# Patient Record
Sex: Female | Born: 1990 | Race: White | Hispanic: No | Marital: Single | Smoking: Never smoker
Health system: Southern US, Community
[De-identification: ages and names within clinical notes are randomized; demographics above are authoritative.]

## PROBLEM LIST (undated history)

## (undated) DIAGNOSIS — F419 Anxiety disorder, unspecified: Secondary | ICD-10-CM

## (undated) DIAGNOSIS — G43909 Migraine, unspecified, not intractable, without status migrainosus: Secondary | ICD-10-CM

## (undated) HISTORY — DX: Migraine, unspecified, not intractable, without status migrainosus: G43.909

## (undated) HISTORY — PX: WISDOM TOOTH EXTRACTION: SHX21

## (undated) HISTORY — DX: Anxiety disorder, unspecified: F41.9

## (undated) NOTE — *Deleted (*Deleted)
Chief Complaint:  Vaginal Bleeding   First Provider Initiated Contact with Patient 04/25/20 1956     HPI: Elizabeth [name] is a 73 y.o. G1P0000 at [redacted]w[redacted]d who presents to maternity admissions reporting one episode of pink spotting in her underwear this afternoon. She thinks she had some brown discharge yesterday but is not sure. She has had some mild cramping today but no tightness or Braxton-Hicks. She called into her office 04/19/20 reporting abnormal discharge she thinks might be BV. Denies vaginal bleeding, leaking of fluid, decreased fetal movement, fever, falls, or recent illness.   Past Medical History:  Diagnosis Date  . Anxiety    OB History  Gravida Para Term Preterm AB Living  1 0 0 0 0 0  SAB TAB Ectopic Multiple Live Births  0 0 0 0 0    # Outcome Date GA Lbr Len/2nd Weight Sex Delivery Anes PTL Lv  1 Current            Past Surgical History:  Procedure Laterality Date  . WISDOM TOOTH EXTRACTION     Family History  Problem Relation Age of Onset  . Cancer Maternal Aunt    Social History   Tobacco Use  . Smoking status: Never Smoker  . Smokeless tobacco: Never Used  Substance Use Topics  . Alcohol use: Not Currently    Comment: social  . Drug use: No   No Known Allergies Medications Prior to Admission  Medication Sig Dispense Refill Last Dose  . aspirin EC 81 MG tablet Take 81 mg by mouth daily. Swallow whole.   04/25/2020 at Unknown time  . famotidine (PEPCID) 20 MG tablet Take 1 tablet (20 mg total) by mouth 2 (two) times daily. 60 tablet 1 04/25/2020 at Unknown time  . Prenatal Vit-Fe Fumarate-FA (MULTIVITAMIN-PRENATAL) 27-0.8 MG TABS tablet Take 1 tablet by mouth daily at 12 noon.   04/25/2020 at Unknown time  . Vitamin D, Cholecalciferol, 50 MCG (2000 UT) CAPS Take by mouth.   04/25/2020 at Unknown time  . Butalbital-APAP-Caffeine 50-325-40 MG capsule Take 1-2 capsules by mouth every 6 (six) hours as needed for headache. (Patient not taking: Reported on 01/11/2020)  30 capsule 1   . promethazine (PHENERGAN) 25 MG tablet Take 1 tablet (25 mg total) by mouth every 6 (six) hours as needed for nausea or vomiting. (Patient not taking: Reported on 01/11/2020) 30 tablet 1     I have reviewed patient's Past Medical Hx, Surgical Hx, Family Hx, Social Hx, medications and allergies.   ROS:  Review of Systems  Constitutional: Negative for fatigue and fever.  HENT: Negative for congestion and sore throat.   Respiratory: Negative for shortness of breath.   Gastrointestinal: Negative for nausea.  Genitourinary: Positive for pelvic pain (occasional cramping, no tightness), vaginal bleeding (pink) and vaginal discharge (increased x1wk).  Neurological: Negative for dizziness and syncope.  All other systems reviewed and are negative.   Physical Exam   Patient Vitals for the past 24 hrs:  BP Temp Temp src Pulse Resp SpO2 Height Weight  04/25/20 2001 127/65 - - (!) 113 - - - -  04/25/20 1931 137/79 - - (!) 124 - - - -  04/25/20 1912 (!) 145/85 99.1 F (37.3 C) Oral (!) 128 17 99 % 5\' 4"  (1.626 m) 227 lb (103 kg)    Constitutional: Well-developed, well-nourished female in no acute distress.  Cardiovascular: normal rate & rhythm, no murmur Respiratory: normal effort, lung sounds clear throughout GI: Abd soft, non-tender, gravid  appropriate for gestational age. Pos BS x 4 MS: Extremities nontender, no edema, normal ROM Neurologic: Alert and oriented x 4.  Pelvic: NEFG, physiologic discharge (off-white discharge noted), no blood.  Dilation: Closed Effacement (%): Thick Cervical Position: Posterior Station: Ballotable Presentation: Vertex Exam by:: Edd Arbour, CNM  Fetal Tracing: reactive Baseline: 145 Variability: moderate Accelerations: present Decelerations: none Toco: UI   Labs: Results for orders placed or performed during the hospital encounter of 04/25/20 (from the past 24 hour(s))  Urinalysis, Routine w reflex microscopic Urine, Clean Catch      Status: Abnormal   Collection Time: 04/25/20  7:16 PM  Result Value Ref Range   Color, Urine STRAW (A) YELLOW   APPearance CLEAR CLEAR   Specific Gravity, Urine 1.005 1.005 - 1.030   pH 6.0 5.0 - 8.0   Glucose, UA NEGATIVE NEGATIVE mg/dL   Hgb urine dipstick NEGATIVE NEGATIVE   Bilirubin Urine NEGATIVE NEGATIVE   Ketones, ur NEGATIVE NEGATIVE mg/dL   Protein, ur NEGATIVE NEGATIVE mg/dL   Nitrite NEGATIVE NEGATIVE   Leukocytes,Ua NEGATIVE NEGATIVE  Protein / creatinine ratio, urine     Status: None   Collection Time: 04/25/20  7:39 PM  Result Value Ref Range   Creatinine, Urine 39.45 mg/dL   Total Protein, Urine <6.0 mg/dL   Protein Creatinine Ratio        0.00 - 0.15 mg/mg[Cre]  CBC     Status: Abnormal   Collection Time: 04/25/20  7:48 PM  Result Value Ref Range   WBC 15.4 (H) 4.0 - 10.5 K/uL   RBC 3.99 3.87 - 5.11 MIL/uL   Hemoglobin 12.6 12.0 - 15.0 g/dL   HCT 01.0 36 - 46 %   MCV 96.2 80.0 - 100.0 fL   MCH 31.6 26.0 - 34.0 pg   MCHC 32.8 30.0 - 36.0 g/dL   RDW 27.2 53.6 - 64.4 %   Platelets 190 150 - 400 K/uL   nRBC 0.0 0.0 - 0.2 %  Comprehensive metabolic panel     Status: Abnormal   Collection Time: 04/25/20  7:48 PM  Result Value Ref Range   Sodium 132 (L) 135 - 145 mmol/L   Potassium 3.5 3.5 - 5.1 mmol/L   Chloride 101 98 - 111 mmol/L   CO2 21 (L) 22 - 32 mmol/L   Glucose, Bld 89 70 - 99 mg/dL   BUN 6 6 - 20 mg/dL   Creatinine, Ser 0.34 0.44 - 1.00 mg/dL   Calcium 8.6 (L) 8.9 - 10.3 mg/dL   Total Protein 6.3 (L) 6.5 - 8.1 g/dL   Albumin 3.0 (L) 3.5 - 5.0 g/dL   AST 16 15 - 41 U/L   ALT 15 0 - 44 U/L   Alkaline Phosphatase 89 38 - 126 U/L   Total Bilirubin 0.4 0.3 - 1.2 mg/dL   GFR, Estimated >74 >25 mL/min   Anion gap 10 5 - 15  Wet prep, genital     Status: Abnormal   Collection Time: 04/25/20  8:16 PM  Result Value Ref Range   Yeast Wet Prep HPF POC NONE SEEN NONE SEEN   Trich, Wet Prep NONE SEEN NONE SEEN   Clue Cells Wet Prep HPF POC NONE SEEN NONE  SEEN   WBC, Wet Prep HPF POC MANY (A) NONE SEEN   Sperm NONE SEEN    Imaging:  No results found.  MAU Course: Orders Placed This Encounter  Procedures  . Wet prep, genital  . Urinalysis, Routine w reflex  microscopic Urine, Clean Catch  . CBC  . Comprehensive metabolic panel  . Protein / creatinine ratio, urine   MDM: Preeclampsia workup normal Wet prep/UA normal Ultrasound pending  Care turned over to Donia Ast, NP at 2124  Speculum exam performed, cervix appears visually closed, non-friable cervix, no bleeding noted on exam. [place] order placed. Report given and care turned over to Wynelle Bourgeois, CNM.  Marylen Ponto, NP  9:43 PM 04/25/2020

---

## 2014-10-03 ENCOUNTER — Encounter: Payer: Self-pay | Admitting: Obstetrics & Gynecology

## 2014-10-07 ENCOUNTER — Encounter: Payer: Self-pay | Admitting: Obstetrics & Gynecology

## 2014-10-07 ENCOUNTER — Ambulatory Visit (INDEPENDENT_AMBULATORY_CARE_PROVIDER_SITE_OTHER): Payer: Self-pay | Admitting: Obstetrics & Gynecology

## 2014-10-07 VITALS — BP 107/71 | HR 103 | Ht 64.0 in | Wt 151.1 lb

## 2014-10-07 DIAGNOSIS — Z124 Encounter for screening for malignant neoplasm of cervix: Secondary | ICD-10-CM

## 2014-10-07 DIAGNOSIS — Z01419 Encounter for gynecological examination (general) (routine) without abnormal findings: Secondary | ICD-10-CM

## 2014-10-07 DIAGNOSIS — Z113 Encounter for screening for infections with a predominantly sexual mode of transmission: Secondary | ICD-10-CM

## 2014-10-07 DIAGNOSIS — Z0000 Encounter for general adult medical examination without abnormal findings: Secondary | ICD-10-CM

## 2014-10-07 MED ORDER — LEVONORGEST-ETH ESTRAD 91-DAY 0.15-0.03 &0.01 MG PO TABS: 1.0000 | ORAL_TABLET | Freq: Every day | ORAL | Status: DC

## 2014-10-07 NOTE — Progress Notes (Signed)
Here today for new patient physical and pap smear.  [facility] showed "mild abnormalities" no follow recommended by them.  Has Skyla and would like it removed because it caused her skin to break out and she has cramping usually around her period.  Wanted to discuss other BC options.

## 2014-10-07 NOTE — Progress Notes (Signed)
Subjective:    Elizabeth [name] is a 24 y.o. SW G0 female who presents for an annual exam. The patient has no complaints today. Except she is having acne and cramps the week prior to her period every month. This started [facility]. She tried the OCPs but couldn't remember them. She gained 40# on depo provera. The patient is sexually active. GYN screening history: last pap: was abnormal: uncertain what diagnosis. The patient wears seatbelts: yes. The patient participates in regular exercise: no. Has the patient ever been transfused or tattooed?: yes. The patient reports that there is not domestic violence in her life.   Menstrual History: OB History    No data available      Menarche age: [age]  Patient's last menstrual period was 09/23/2014.    The following portions of the patient's history were reviewed and updated as appropriate: allergies, current medications, past family history, past medical history, past social history, past surgical history and problem list.  Review of Systems A comprehensive review of systems was negative. 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 She broke up with her BF and not been having sex for the last month.   Objective:    BP 107/71 mmHg  Pulse 103  Ht 5\' 4"  (1.626 m)  Wt 151 lb 2 oz (68.55 kg)  BMI 25.93 kg/m2  LMP 09/23/2014  General Appearance:    Alert, cooperative, no distress, appears stated age  Head:    Normocephalic, without obvious abnormality, atraumatic  Eyes:    PERRL, conjunctiva/corneas clear, EOM's intact, fundi    benign, both eyes  Ears:    Normal TM's and external ear canals, both ears  Nose:   Nares normal, septum midline, mucosa normal, no drainage    or sinus tenderness  Throat:   Lips, mucosa, and tongue normal; teeth and gums normal  Neck:   Supple, symmetrical, trachea midline, no adenopathy;    thyroid:  no enlargement/tenderness/nodules; no carotid   bruit or JVD  Back:      Symmetric, no curvature, ROM normal, no CVA tenderness  Lungs:     Clear to auscultation bilaterally, respirations unlabored  Chest Wall:    No tenderness or deformity   Heart:    Regular rate and rhythm, S1 and S2 normal, no murmur, rub   or gallop  Breast Exam:    No tenderness, masses, or nipple abnormality  Abdomen:     Soft, non-tender, bowel sounds active all four quadrants,    no masses, no organomegaly  Genitalia:    Normal female without lesion, discharge or tenderness, Skyla strings visible, ULN size, minimal mobility, but NT, normal adnexal exam     Extremities:   Extremities normal, atraumatic, no cyanosis or edema  Pulses:   2+ and symmetric all extremities  Skin:   Skin color, texture, turgor normal, no rashes or lesions  Lymph nodes:   Cervical, supraclavicular, and axillary nodes normal  Neurologic:   CNII-XII intact, normal strength, sensation and reflexes    throughout  .    Assessment:    Healthy female exam.   Acne/cramps   Plan:     Breast self exam technique reviewed and patient encouraged to perform self-exam monthly. Chlamydia specimen. GC specimen. Thin prep Pap smear.   Add camrese for acne/cramps

## 2014-10-10 LAB — CYTOLOGY - PAP

## 2016-01-31 ENCOUNTER — Ambulatory Visit (INDEPENDENT_AMBULATORY_CARE_PROVIDER_SITE_OTHER): Payer: BLUE CROSS/BLUE SHIELD | Admitting: Obstetrics & Gynecology

## 2016-01-31 ENCOUNTER — Encounter: Payer: Self-pay | Admitting: Obstetrics & Gynecology

## 2016-01-31 VITALS — BP 109/71 | HR 84 | Ht 64.0 in | Wt 168.0 lb

## 2016-01-31 DIAGNOSIS — Z30431 Encounter for routine checking of intrauterine contraceptive device: Secondary | ICD-10-CM | POA: Diagnosis not present

## 2016-01-31 DIAGNOSIS — Z1151 Encounter for screening for human papillomavirus (HPV): Secondary | ICD-10-CM

## 2016-01-31 DIAGNOSIS — Z113 Encounter for screening for infections with a predominantly sexual mode of transmission: Secondary | ICD-10-CM | POA: Diagnosis not present

## 2016-01-31 DIAGNOSIS — Z01419 Encounter for gynecological examination (general) (routine) without abnormal findings: Secondary | ICD-10-CM

## 2016-01-31 DIAGNOSIS — Z124 Encounter for screening for malignant neoplasm of cervix: Secondary | ICD-10-CM | POA: Diagnosis not present

## 2016-01-31 DIAGNOSIS — R635 Abnormal weight gain: Secondary | ICD-10-CM | POA: Diagnosis not present

## 2016-01-31 MED ORDER — MISOPROSTOL 200 MCG PO TABS: ORAL_TABLET | ORAL | 0 refills | Status: DC

## 2016-01-31 NOTE — Progress Notes (Signed)
Subjective:    Elizabeth [name] is a 25 y.o. SW G0 female who presents for an annual exam. The patient has no complaints today. The patient is sexually active. She has a new partner for the last 6 months. GYN screening history: last pap: was normal. The patient wears seatbelts: yes. The patient participates in regular exercise: yes. Has the patient ever been transfused or tattooed?: yes. The patient reports that there is not domestic violence in her life.   Menstrual History: OB History    Gravida Para Term Preterm AB Living   0 0 0 0 0 0   SAB TAB Ectopic Multiple Live Births   0 0 0 0 0      Menarche age: 32 No LMP recorded. Patient is not currently having periods (Reason: IUD).    The following portions of the patient's history were reviewed and updated as appropriate: allergies, current medications, past family history, past medical history, past social history, past surgical history and problem list.  Review of Systems Pertinent items are noted in HPI. She denies dyspareunia. Denies FH of breast, gyn, colon cancer.    Objective:    BP 109/71   Pulse 84   Ht 5\' 4"  (1.626 m)   Wt 168 lb (76.2 kg)   BMI 28.84 kg/m   General Appearance:    Alert, cooperative, no distress, appears stated age  Head:    Normocephalic, without obvious abnormality, atraumatic  Eyes:    PERRL, conjunctiva/corneas clear, EOM's intact, fundi    benign, both eyes  Ears:    Normal TM's and external ear canals, both ears  Nose:   Nares normal, septum midline, mucosa normal, no drainage    or sinus tenderness  Throat:   Lips, mucosa, and tongue normal; teeth and gums normal  Neck:   Supple, symmetrical, trachea midline, no adenopathy;    thyroid:  no enlargement/tenderness/nodules; no carotid   bruit or JVD  Back:     Symmetric, no curvature, ROM normal, no CVA tenderness  Lungs:     Clear to auscultation bilaterally, respirations unlabored  Chest Wall:    No tenderness or deformity   Heart:    Regular rate  and rhythm, S1 and S2 normal, no murmur, rub   or gallop  Breast Exam:    No tenderness, masses, or nipple abnormality  Abdomen:     Soft, non-tender, bowel sounds active all four quadrants,    no masses, no organomegaly  Genitalia:    Normal female without lesion, discharge or tenderness, NSSR, No adnexal masses     Extremities:   Extremities normal, atraumatic, no cyanosis or edema  Pulses:   2+ and symmetric all extremities  Skin:   Skin color, texture, turgor normal, no rashes or lesions  Lymph nodes:   Cervical, supraclavicular, and axillary nodes normal  Neurologic:   CNII-XII intact, normal strength, sensation and reflexes    throughout   .    Assessment:    Healthy female exam.   Weight gain   Plan:     Chlamydia specimen. GC specimen. Thin prep Pap smear. tsh   Schedule Mirena insertion as her Christean Grief is expiring soon. She will take cytotec the night prior to insertion.

## 2016-02-01 LAB — CYTOLOGY - PAP

## 2016-02-08 ENCOUNTER — Telehealth: Payer: Self-pay | Admitting: *Deleted

## 2016-02-08 NOTE — Telephone Encounter (Signed)
-----   Message from Myra C Dove, MD sent at 02/08/2016  9:09 AM EDT ----- Pap with cotesting in a year Thanks 

## 2016-02-08 NOTE — Telephone Encounter (Signed)
-----   Message from Allie Bossier, MD sent at 02/08/2016  9:09 AM EDT ----- Pap with cotesting in a year Thanks

## 2016-02-08 NOTE — Telephone Encounter (Signed)
Called pt, no answer, left message to call the office.  

## 2016-02-08 NOTE — Telephone Encounter (Signed)
Pt returned call, informed her of pap result and recommendations, had no further questions at this time.

## 2016-02-15 ENCOUNTER — Ambulatory Visit (INDEPENDENT_AMBULATORY_CARE_PROVIDER_SITE_OTHER): Payer: BLUE CROSS/BLUE SHIELD | Admitting: Obstetrics & Gynecology

## 2016-02-15 ENCOUNTER — Encounter: Payer: Self-pay | Admitting: Obstetrics & Gynecology

## 2016-02-15 DIAGNOSIS — Z30433 Encounter for removal and reinsertion of intrauterine contraceptive device: Secondary | ICD-10-CM | POA: Diagnosis not present

## 2016-02-15 MED ORDER — LEVONORGESTREL 18.6 MCG/DAY IU IUD
INTRAUTERINE_SYSTEM | Freq: Once | INTRAUTERINE | Status: AC
  Administered 2016-02-15: 10:00:00 via INTRAUTERINE

## 2016-02-15 NOTE — Progress Notes (Signed)
   Subjective:    Patient ID: Elizabeth [name], female    DOB: 1990/12/27, 25 y.o.   MRN: [id number]  HPI 25 yo SW G0 here for removal of her expiring Skyla and insertion of Liletta. She took cytotec last night.   Review of Systems     Objective:   Physical Exam [id number] Breathing, conversing, and ambulating normally Her Christean GriefSkyla was easily removed and noted to be intact UPT negative, consent signed, Time out procedure done. Cervix prepped with betadine and grasped with a single tooth tenaculum. Liletta was easily placed and the strings were cut to 3-4 cm. Uterus sounded to 8 cm. She tolerated the procedure well.         Assessment & Plan:  Contraception- Elizabeth BombardLiletta

## 2016-02-15 NOTE — Addendum Note (Signed)
Addended by: Arne ClevelandHUTCHINSON, MANDY J on: 02/15/2016 09:59 AM   Modules accepted: Orders

## 2017-03-13 ENCOUNTER — Ambulatory Visit: Payer: BLUE CROSS/BLUE SHIELD | Admitting: Obstetrics & Gynecology

## 2017-04-10 ENCOUNTER — Ambulatory Visit: Payer: BLUE CROSS/BLUE SHIELD | Admitting: Obstetrics & Gynecology

## 2017-12-08 ENCOUNTER — Ambulatory Visit: Payer: BLUE CROSS/BLUE SHIELD | Admitting: Obstetrics & Gynecology

## 2018-01-29 NOTE — Progress Notes (Signed)
Liletta IUD 2017  LAST PAP 01/31/2018-ASC-US

## 2018-01-30 ENCOUNTER — Encounter: Payer: Self-pay | Admitting: Obstetrics & Gynecology

## 2018-01-30 ENCOUNTER — Ambulatory Visit (INDEPENDENT_AMBULATORY_CARE_PROVIDER_SITE_OTHER): Payer: Commercial Managed Care - PPO | Admitting: Obstetrics & Gynecology

## 2018-01-30 VITALS — BP 113/77 | HR 88 | Ht 64.0 in | Wt 197.2 lb

## 2018-01-30 DIAGNOSIS — Z124 Encounter for screening for malignant neoplasm of cervix: Secondary | ICD-10-CM | POA: Diagnosis not present

## 2018-01-30 DIAGNOSIS — Z01419 Encounter for gynecological examination (general) (routine) without abnormal findings: Secondary | ICD-10-CM

## 2018-01-30 MED ORDER — NORGESTREL-ETHINYL ESTRADIOL 0.3-30 MG-MCG PO TABS: 1.0000 | ORAL_TABLET | Freq: Every day | ORAL | 11 refills | Status: DC

## 2018-01-30 NOTE — Addendum Note (Signed)
Addended by: Hamilton CapriBURCH, Teruko Joswick J on: 01/30/2018 11:46 AM   Modules accepted: Orders

## 2018-01-30 NOTE — Progress Notes (Signed)
Subjective:    Elizabeth [name] is a 27 y.o. engaged P340female who presents for an annual exam She is frustrated with weight gain, tried Adipex in the past. She doesn't like her Liletta, has cramping and feels bloated and anxiety. She would like to restart OCPs.  The patient is sexually active. GYN screening history: last pap: was abnormal: ASCUS, HPV negative. The patient wears seatbelts: yes. The patient participates in regular exercise: no. Has the patient ever been transfused or tattooed?: yes. The patient reports that there is not domestic violence in her life.   Menstrual History: OB History    Gravida  0   Para  0   Term  0   Preterm  0   AB  0   Living  0     SAB  0   TAB  0   Ectopic  0   Multiple  0   Live Births  0           Menarche age: 27 Patient's last menstrual period was 01/30/2018.    The following portions of the patient's history were reviewed and updated as appropriate: allergies, current medications, past family history, past medical history, past social history, past surgical history and problem list.  Review of Systems Pertinent items are noted in HPI.   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    Objective:    BP 113/77   Pulse 88   Ht 5\' 4"  (1.626 m)   Wt 197 lb 3.2 oz (89.4 kg)   LMP 01/30/2018   BMI 33.85 kg/m   General Appearance:    Alert, cooperative, no distress, appears stated age  Head:    Normocephalic, without obvious abnormality, atraumatic  Eyes:    PERRL, conjunctiva/corneas clear, EOM's intact, fundi    benign, both eyes  Ears:    Normal TM's and external ear canals, both ears  Nose:   Nares normal, septum midline, mucosa normal, no drainage    or sinus tenderness  Throat:   Lips, mucosa, and tongue normal; teeth and gums normal  Neck:   Supple, symmetrical, trachea midline, no adenopathy;    thyroid:  no enlargement/tenderness/nodules; no carotid   bruit or JVD   Back:     Symmetric, no curvature, ROM normal, no CVA tenderness  Lungs:     Clear to auscultation bilaterally, respirations unlabored  Chest Wall:    No tenderness or deformity   Heart:    Regular rate and rhythm, S1 and S2 normal, no murmur, rub   or gallop  Breast Exam:    No tenderness, masses, or nipple abnormality  Abdomen:     Soft, non-tender, bowel sounds active all four quadrants,    no masses, no organomegaly  Genitalia:    Normal female without lesion, discharge or tenderness, Liletta strings visualized,  normal size and shape, midplane, mobile, non-tender, normal adnexal exam      Extremities:   Extremities normal, atraumatic, no cyanosis or edema  Pulses:   2+ and symmetric all extremities  Skin:   Skin color, texture, turgor normal, no rashes or lesions  Lymph nodes:   Cervical, supraclavicular, and axillary nodes normal  Neurologic:   CNII-XII intact, normal strength, sensation and reflexes    throughout  .    Assessment:    Healthy female exam.   Cramping with IUD Weight gain   Plan:     Thin prep Pap smear.  Declines bariatric referral at this time Lo ovral prescribed Remove IUD in 2-3 weeks Fasting labs today

## 2018-01-31 LAB — CBC
HEMATOCRIT: 44.8 % (ref 34.0–46.6)
HEMOGLOBIN: 15.1 g/dL (ref 11.1–15.9)
MCH: 32.4 pg (ref 26.6–33.0)
MCHC: 33.7 g/dL (ref 31.5–35.7)
MCV: 96 fL (ref 79–97)
Platelets: 262 10*3/uL (ref 150–450)
RBC: 4.66 x10E6/uL (ref 3.77–5.28)
RDW: 13.5 % (ref 12.3–15.4)
WBC: 8.8 10*3/uL (ref 3.4–10.8)

## 2018-01-31 LAB — COMPREHENSIVE METABOLIC PANEL
ALBUMIN: 4.6 g/dL (ref 3.5–5.5)
ALK PHOS: 88 IU/L (ref 39–117)
ALT: 19 IU/L (ref 0–32)
AST: 21 IU/L (ref 0–40)
Albumin/Globulin Ratio: 1.6 (ref 1.2–2.2)
BUN/Creatinine Ratio: 14 (ref 9–23)
BUN: 10 mg/dL (ref 6–20)
Bilirubin Total: 0.4 mg/dL (ref 0.0–1.2)
CALCIUM: 9.4 mg/dL (ref 8.7–10.2)
CO2: 20 mmol/L (ref 20–29)
CREATININE: 0.71 mg/dL (ref 0.57–1.00)
Chloride: 101 mmol/L (ref 96–106)
GFR calc Af Amer: 135 mL/min/{1.73_m2} (ref >59)
GFR calc non Af Amer: 117 mL/min/{1.73_m2} (ref >59)
Globulin, Total: 2.8 g/dL (ref 1.5–4.5)
Glucose: 86 mg/dL (ref 65–99)
Potassium: 4.4 mmol/L (ref 3.5–5.2)
Sodium: 139 mmol/L (ref 134–144)
Total Protein: 7.4 g/dL (ref 6.0–8.5)

## 2018-01-31 LAB — LIPID PANEL
CHOLESTEROL TOTAL: 182 mg/dL (ref 100–199)
Chol/HDL Ratio: 2.8 ratio (ref 0.0–4.4)
HDL: 64 mg/dL (ref >39)
LDL CALC: 105 mg/dL — AB (ref 0–99)
TRIGLYCERIDES: 64 mg/dL (ref 0–149)
VLDL Cholesterol Cal: 13 mg/dL (ref 5–40)

## 2018-01-31 LAB — TSH: TSH: 2.17 u[IU]/mL (ref 0.450–4.500)

## 2018-01-31 LAB — VITAMIN D 25 HYDROXY (VIT D DEFICIENCY, FRACTURES): Vit D, 25-Hydroxy: 25.6 ng/mL — ABNORMAL LOW (ref 30.0–100.0)

## 2018-02-02 ENCOUNTER — Telehealth: Payer: Self-pay | Admitting: *Deleted

## 2018-02-02 ENCOUNTER — Other Ambulatory Visit: Payer: Self-pay | Admitting: Obstetrics & Gynecology

## 2018-02-02 MED ORDER — VITAMIN D (ERGOCALCIFEROL) 1.25 MG (50000 UNIT) PO CAPS: 50000.0000 [IU] | ORAL_CAPSULE | ORAL | 0 refills | Status: DC

## 2018-02-02 NOTE — Progress Notes (Signed)
Vitamin D prescribed.

## 2018-02-02 NOTE — Telephone Encounter (Signed)
Requested by Dr.Dove to call in Vitamin D rx she has prescribed but will not go thru eprescribe, printed. I [facility] [place] as [place].

## 2018-02-03 LAB — CYTOLOGY - PAP
ADEQUACY: ABSENT
DIAGNOSIS: NEGATIVE

## 2018-02-20 ENCOUNTER — Encounter: Payer: Self-pay | Admitting: Obstetrics & Gynecology

## 2018-02-20 ENCOUNTER — Ambulatory Visit (INDEPENDENT_AMBULATORY_CARE_PROVIDER_SITE_OTHER): Payer: Commercial Managed Care - PPO | Admitting: Obstetrics & Gynecology

## 2018-02-20 VITALS — BP 117/71 | HR 72 | Wt 221.0 lb

## 2018-02-20 DIAGNOSIS — Z30432 Encounter for removal of intrauterine contraceptive device: Secondary | ICD-10-CM | POA: Diagnosis not present

## 2018-02-20 DIAGNOSIS — Z23 Encounter for immunization: Secondary | ICD-10-CM

## 2018-02-20 DIAGNOSIS — Z0000 Encounter for general adult medical examination without abnormal findings: Secondary | ICD-10-CM

## 2018-02-20 NOTE — Progress Notes (Signed)
   Subjective:    Patient ID: Elizabeth [name], female    DOB: 06-Sep-1990, 27 y.o.   MRN: [id number]  HPI 27 yo engaged P0 here for removal of Liletta. She started taking Lo ovral about 3 weeks ago.    Review of Systems     Objective:   Physical Exam  Breathing, conversing, and ambulating normally Well nourished, well hydrated White female, no apparent distress Intact Liletta easily removed. She tolerated the procedure well      Assessment & Plan:  Contraception- continue pills daily, rec back up with abx Recheck Vitamin d in 6 months Flu vaccine today

## 2018-03-30 ENCOUNTER — Other Ambulatory Visit: Payer: Self-pay

## 2018-04-02 ENCOUNTER — Telehealth: Payer: Self-pay | Admitting: General Practice

## 2018-04-02 NOTE — Telephone Encounter (Signed)
[facility] called and left message on nurse voicemail line stating they are missing quantity and direction information on the patient's cryselle Rx. They are requesting a call back to [phone] reference number [id number]

## 2018-04-06 NOTE — Telephone Encounter (Signed)
Return [facility] call - spoke to Paula/Pharmacist - per provider notation - advised Cryselle 0.3 -030 mg-mcg 1 tablet oral daily 1 package 11/RF.

## 2018-08-24 ENCOUNTER — Other Ambulatory Visit: Payer: Commercial Managed Care - PPO

## 2018-08-24 DIAGNOSIS — R7989 Other specified abnormal findings of blood chemistry: Secondary | ICD-10-CM

## 2018-08-25 LAB — VITAMIN D 25 HYDROXY (VIT D DEFICIENCY, FRACTURES): VIT D 25 HYDROXY: 28.4 ng/mL — AB (ref 30.0–100.0)

## 2018-09-04 ENCOUNTER — Other Ambulatory Visit: Payer: Self-pay | Admitting: Obstetrics & Gynecology

## 2018-09-04 MED ORDER — VITAMIN D (ERGOCALCIFEROL) 1.25 MG (50000 UNIT) PO CAPS: 50000.0000 [IU] | ORAL_CAPSULE | ORAL | 0 refills | Status: DC

## 2018-09-04 MED ORDER — MEDROXYPROGESTERONE ACETATE 10 MG PO TABS: 10.0000 mg | ORAL_TABLET | Freq: Every day | ORAL | 2 refills | Status: DC

## 2018-09-04 NOTE — Progress Notes (Signed)
Vitamin d prescribed for deficiency Provera 10 day challenge called in also to induce a period per patient request

## 2018-09-08 ENCOUNTER — Ambulatory Visit: Payer: Commercial Managed Care - PPO | Admitting: Obstetrics & Gynecology

## 2018-10-07 ENCOUNTER — Other Ambulatory Visit: Payer: Self-pay | Admitting: Obstetrics & Gynecology

## 2018-10-07 DIAGNOSIS — N912 Amenorrhea, unspecified: Secondary | ICD-10-CM

## 2018-10-07 NOTE — Progress Notes (Signed)
TSH and prolactin ordered for amenorrhea.

## 2018-10-20 ENCOUNTER — Encounter: Payer: Self-pay | Admitting: Radiology

## 2019-01-20 ENCOUNTER — Encounter: Payer: Self-pay | Admitting: Radiology

## 2019-02-18 ENCOUNTER — Ambulatory Visit (INDEPENDENT_AMBULATORY_CARE_PROVIDER_SITE_OTHER): Payer: Commercial Managed Care - PPO | Admitting: Obstetrics & Gynecology

## 2019-02-18 ENCOUNTER — Encounter: Payer: Self-pay | Admitting: Obstetrics & Gynecology

## 2019-02-18 ENCOUNTER — Ambulatory Visit: Payer: Commercial Managed Care - PPO | Admitting: Obstetrics & Gynecology

## 2019-02-18 ENCOUNTER — Other Ambulatory Visit: Payer: Self-pay

## 2019-02-18 VITALS — BP 119/81 | HR 93 | Ht 64.0 in | Wt 205.0 lb

## 2019-02-18 DIAGNOSIS — Z113 Encounter for screening for infections with a predominantly sexual mode of transmission: Secondary | ICD-10-CM | POA: Diagnosis not present

## 2019-02-18 DIAGNOSIS — Z124 Encounter for screening for malignant neoplasm of cervix: Secondary | ICD-10-CM | POA: Diagnosis not present

## 2019-02-18 DIAGNOSIS — Z01419 Encounter for gynecological examination (general) (routine) without abnormal findings: Secondary | ICD-10-CM | POA: Diagnosis not present

## 2019-02-18 DIAGNOSIS — N926 Irregular menstruation, unspecified: Secondary | ICD-10-CM | POA: Insufficient documentation

## 2019-02-18 DIAGNOSIS — E559 Vitamin D deficiency, unspecified: Secondary | ICD-10-CM | POA: Insufficient documentation

## 2019-02-18 DIAGNOSIS — E785 Hyperlipidemia, unspecified: Secondary | ICD-10-CM

## 2019-02-18 NOTE — Progress Notes (Signed)
Subjective:    Elizabeth [name] is a 28 y.o. female who presents for an annual exam. She has been on OCPs for more than a year and still does not have regular periods. She had a normal TSH and prolactin at [employer] where she works. She rarely misses her OCPs. The patient is sexually active. GYN screening history: last pap: was normal but she tells me that she has had abnormalities in the past.  The patient wears seatbelts: yes. The patient participates in regular exercise: yes. Has the patient ever been transfused or tattooed?: yes. The patient reports that there is not domestic violence in her life.   Menstrual History: OB History    Gravida  0   Para  0   Term  0   Preterm  0   AB  0   Living  0     SAB  0   TAB  0   Ectopic  0   Multiple  0   Live Births  0           Menarche age: [age] No LMP recorded. (Menstrual status: Oral contraceptives).    The following portions of the patient's history were reviewed and updated as appropriate: allergies, current medications, past family history, past medical history, past social history, past surgical history and problem list.  Review of Systems Pertinent items are noted in HPI.   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   Objective:    BP 119/81   Pulse 93   Ht 5\' 4"  (1.626 m)   Wt 205 lb (93 kg)   BMI 35.19 kg/m   General Appearance:    Alert, cooperative, no distress, appears stated age  Head:    Normocephalic, without obvious abnormality, atraumatic  Eyes:    PERRL, conjunctiva/corneas clear, EOM's intact, fundi    benign, both eyes  Ears:    Normal TM's and external ear canals, both ears  Nose:   Nares normal, septum midline, mucosa normal, no drainage    or sinus tenderness  Throat:   Lips, mucosa, and tongue normal; teeth and gums normal  Neck:   Supple, symmetrical, trachea midline, no adenopathy;    thyroid:  no  enlargement/tenderness/nodules; no carotid   bruit or JVD  Back:     Symmetric, no curvature, ROM normal, no CVA tenderness  Lungs:     Clear to auscultation bilaterally, respirations unlabored  Chest Wall:    No tenderness or deformity   Heart:    Regular rate and rhythm, S1 and S2 normal, no murmur, rub   or gallop  Breast Exam:    No tenderness, masses, or nipple abnormality  Abdomen:     Soft, non-tender, bowel sounds active all four quadrants,    no masses, no organomegaly  Genitalia:    Normal female without lesion, discharge or tenderness, shaved, bloody discharge at cervix, normal size and shape, retroverted, mobile, non-tender, normal adnexal exam      Extremities:   Extremities normal, atraumatic, no cyanosis or edema  Pulses:   2+ and symmetric all extremities  Skin:   Skin color, texture, turgor normal, no rashes or lesions  Lymph nodes:   Cervical, supraclavicular, and axillary nodes normal  Neurologic:   CNII-XII intact, normal strength, sensation and reflexes    throughout  .    Assessment:    Healthy female exam.   Irregular bleeding with OCPs  Plan:     Thin prep Pap smear. with cotesting Check cervical cultures and gyn u/s If u/s normal, rec EMBX

## 2019-02-19 ENCOUNTER — Other Ambulatory Visit: Payer: Self-pay | Admitting: Obstetrics & Gynecology

## 2019-02-19 LAB — LIPID PANEL
Chol/HDL Ratio: 3.6 ratio (ref 0.0–4.4)
Cholesterol, Total: 186 mg/dL (ref 100–199)
HDL: 51 mg/dL (ref >39)
LDL Calculated: 121 mg/dL — ABNORMAL HIGH (ref 0–99)
Triglycerides: 70 mg/dL (ref 0–149)
VLDL Cholesterol Cal: 14 mg/dL (ref 5–40)

## 2019-02-19 LAB — VITAMIN D 25 HYDROXY (VIT D DEFICIENCY, FRACTURES): Vit D, 25-Hydroxy: 29.4 ng/mL — ABNORMAL LOW (ref 30.0–100.0)

## 2019-02-19 MED ORDER — VITAMIN D (ERGOCALCIFEROL) 1.25 MG (50000 UNIT) PO CAPS: 50000.0000 [IU] | ORAL_CAPSULE | ORAL | 0 refills | Status: DC

## 2019-02-19 NOTE — Progress Notes (Signed)
Vit d prescribed for deficiency Patient notified about elevated LDL, rec'd care from primary care doc.

## 2019-02-22 LAB — CYTOLOGY - PAP
Chlamydia: NEGATIVE
Diagnosis: NEGATIVE
Neisseria Gonorrhea: NEGATIVE

## 2019-12-13 ENCOUNTER — Encounter: Payer: Self-pay | Admitting: Family Medicine

## 2019-12-13 ENCOUNTER — Other Ambulatory Visit: Payer: Self-pay

## 2019-12-13 ENCOUNTER — Ambulatory Visit (INDEPENDENT_AMBULATORY_CARE_PROVIDER_SITE_OTHER): Payer: 59 | Admitting: Family Medicine

## 2019-12-13 ENCOUNTER — Other Ambulatory Visit (HOSPITAL_COMMUNITY)
Admission: RE | Admit: 2019-12-13 | Discharge: 2019-12-13 | Disposition: A | Discharge status: Home / Self Care | Payer: 59 | Source: Ambulatory Visit | Attending: Family Medicine | Admitting: Family Medicine

## 2019-12-13 VITALS — BP 124/77 | HR 112 | Wt 196.0 lb

## 2019-12-13 DIAGNOSIS — Z3400 Encounter for supervision of normal first pregnancy, unspecified trimester: Secondary | ICD-10-CM | POA: Insufficient documentation

## 2019-12-13 DIAGNOSIS — O0991 Supervision of high risk pregnancy, unspecified, first trimester: Secondary | ICD-10-CM | POA: Diagnosis not present

## 2019-12-13 DIAGNOSIS — E6609 Other obesity due to excess calories: Secondary | ICD-10-CM

## 2019-12-13 DIAGNOSIS — O99211 Obesity complicating pregnancy, first trimester: Secondary | ICD-10-CM | POA: Diagnosis not present

## 2019-12-13 DIAGNOSIS — O0990 Supervision of high risk pregnancy, unspecified, unspecified trimester: Secondary | ICD-10-CM | POA: Insufficient documentation

## 2019-12-13 DIAGNOSIS — Z3A12 12 weeks gestation of pregnancy: Secondary | ICD-10-CM | POA: Diagnosis not present

## 2019-12-13 NOTE — Progress Notes (Signed)
DATING AND VIABILITY SONOGRAM   Elizabeth [name] is a 29 y.o. year old G1P0000 with LMP Patient's last menstrual period was 09/17/2019 (exact date). which would correlate to  [redacted]w[redacted]d weeks gestation.  She has regular menstrual cycles.   She is here today for a confirmatory initial sonogram.    GESTATION: SINGLETON yes     FETAL ACTIVITY:          Heart rate         162          The fetus is active.   GESTATIONAL AGE AND  BIOMETRICS:  Gestational criteria: Estimated Date of Delivery: 06/23/20 by LMP now at [redacted]w[redacted]d  Previous Scans:0  GESTATIONAL SAC            mm          weeks  CROWN RUMP LENGTH           6.02 mm         12.3 weeks                                                   AVERAGE EGA(BY THIS SCAN):  12.3 weeks  WORKING EDD( LMP ):  06/23/2020     TECHNICIAN COMMENTS:  Patient informed that the ultrasound is considered a limited obstetric ultrasound and is not intended to be a complete ultrasound exam. Patient also informed that the ultrasound is not being completed with the intent of assessing for fetal or placental anomalies or any pelvic abnormalities. Explained that the purpose of today's ultrasound is to assess for fetal heart rate. Patient acknowledges the purpose of the exam and the limitations of the study.       A copy of this report including all images has been saved and backed up to a second source for retrieval if needed. All measures and details of the anatomical scan, placentation, fluid volume and pelvic anatomy are contained in that report.  Scheryl Marten 12/13/2019 4:09 PM

## 2019-12-13 NOTE — Progress Notes (Signed)
INITIAL PRENATAL VISIT  Subjective:   Elizabeth [name] is being seen today for her first obstetrical visit.  This is not a planned pregnancy. This is a desired pregnancy.  She is at [redacted]w[redacted]d gestation by LMP and [place] today.  Her obstetrical history is significant for obesity. Relationship with FOB: spouse, living together. Patient does intend to breast feed. Pregnancy history fully reviewed.  Patient reports no complaints.  Indications for ASA therapy (per uptodate) One of the following: Previous pregnancy with preeclampsia, especially early onset and with an adverse outcome No Multifetal gestation No Chronic hypertension No Type 1 or 2 diabetes mellitus No Chronic kidney disease No Autoimmune disease (antiphospholipid syndrome, systemic lupus erythematosus) No  Two or more of the following: Nulliparity Yes Obesity (body mass index >30 kg/m2) Yes Family history of preeclampsia in mother or sister No Age ?35 years No Sociodemographic characteristics (African American race, low socioeconomic level) No Personal risk factors (eg, previous pregnancy with low birth weight or small for gestational age infant, previous adverse pregnancy outcome [eg, stillbirth], interval >10 years between pregnancies) No  Indications for early GDM screening  First-degree relative with diabetes No BMI >30kg/m2 Yes Age > 25 Yes Previous birth of an infant weighing ?4000 g No Gestational diabetes mellitus in a previous pregnancy No Glycated hemoglobin ?5.7 percent (39 mmol/mol), impaired glucose tolerance, or impaired fasting glucose on previous testing No High-risk race/ethnicity (eg, African American, Latino, Native American, Asian American, Pacific Islander) No Previous stillbirth of unknown cause No Maternal birthweight > 9 lbs No History of cardiovascular disease No Hypertension or on therapy for hypertension No High-density lipoprotein cholesterol level <35 mg/dL (0.90 mmol/L) and/or a triglyceride level  >250 mg/dL (2.82 mmol/L) No Polycystic ovary syndrome No Physical inactivity No Other clinical condition associated with insulin resistance (eg, severe obesity, acanthosis nigricans) No Current use of glucocorticoids No   Early screening tests: A1C  Review of Systems:   Review of Systems  Objective:    Obstetric History OB History  Gravida Para Term Preterm AB Living  1 0 0 0 0 0  SAB TAB Ectopic Multiple Live Births  0 0 0 0 0    # Outcome Date GA Lbr Len/2nd Weight Sex Delivery Anes PTL Lv  1 Current             Past Medical History:  Diagnosis Date  . Anxiety     History reviewed. No pertinent surgical history.  Current Outpatient Medications on File Prior to Visit  Medication Sig Dispense Refill  . Prenatal Vit-Fe Fumarate-FA (MULTIVITAMIN-PRENATAL) 27-0.8 MG TABS tablet Take 1 tablet by mouth daily at 12 noon.     No current facility-administered medications on file prior to visit.    No Known Allergies  Social History:  reports that she has never smoked. She has never used smokeless tobacco. She reports current alcohol use. She reports that she does not use drugs.  Family History  Problem Relation Age of Onset  . Cancer Maternal Aunt     The following portions of the patient's history were reviewed and updated as appropriate: allergies, current medications, past family history, past medical history, past social history, past surgical history and problem list.  Review of Systems Review of Systems    Physical Exam:  BP 124/77   Pulse (!) 112   Wt 196 lb (88.9 kg)   LMP 09/17/2019 (Exact Date)   BMI 33.64 kg/m  CONSTITUTIONAL: Well-developed, well-nourished female in no acute distress.  HENT:  Normocephalic,  atraumatic, External right and left ear normal. Oropharynx is clear and moist EYES: Conjunctivae normal. No scleral icterus.  NECK: Normal range of motion, supple, no masses.  Normal thyroid.  SKIN: Skin is warm and dry. No rash noted. Not  diaphoretic. No erythema. No pallor. MUSCULOSKELETAL: Normal range of motion. No tenderness.  No cyanosis, clubbing, or edema.   NEUROLOGIC: Alert and oriented to person, place, and time. Normal muscle tone coordination.  PSYCHIATRIC: Normal mood and affect. Normal behavior. Normal judgment and thought content. CARDIOVASCULAR: Normal heart rate noted, regular rhythm RESPIRATORY: Clear to auscultation bilaterally. Effort and breath sounds normal, no problems with respiration noted. BREASTS: Symmetric in size. No masses, skin changes, nipple drainage, or lymphadenopathy. ABDOMEN: Soft, normal bowel sounds, no distention noted.  No tenderness, rebound or guarding. Fundal ht: below pelvic rim PELVIC: Normal appearing external genitalia; normal appearing vaginal mucosa and cervix.  No abnormal discharge noted.  .  Normal uterine size, no other palpable masses, no uterine or adnexal tenderness. FHR: 162   Assessment:    Pregnancy: G1P0000 1. Supervision of normal first pregnancy, antepartum - Culture, OB Urine - Genetic Screening - GC/Chlamydia probe amp ([place])not at [facility] - [place] MFM OB COMP + 14 WK; Future - Babyscripts Schedule Optimization - CBC/D/Plt+RPR+Rh+ABO+Rub Ab... - Hgb Fractionation Cascade - Vitamin D    Plan:     Initial labs drawn. Prenatal vitamins. Problem list reviewed and updated. Reviewed in detail the nature of the practice with collaborative care between  Genetic screening discussed: NIPS/First trimester screen/Quad/AFP requested. Role of ultrasound in pregnancy discussed; Anatomy US: requested. Amniocentesis discussed: not indicated. Follow up in 4 weeks. Discussed clinic routines, schedule of care and testing, genetic screening options, involvement of students and residents under the direct supervision of APPs and doctors and presence of female providers. Pt verbalized understanding.   Federico Flake, MD 12/13/2019 3:50 PM

## 2019-12-14 LAB — CBC/D/PLT+RPR+RH+ABO+RUB AB...
Antibody Screen: NEGATIVE
Basophils Absolute: 0 10*3/uL (ref 0.0–0.2)
Basos: 0 %
EOS (ABSOLUTE): 0.1 10*3/uL (ref 0.0–0.4)
Eos: 1 %
HCV Ab: <0.1 s/co ratio (ref 0.0–0.9)
HIV Screen 4th Generation wRfx: NONREACTIVE
Hematocrit: 45.1 % (ref 34.0–46.6)
Hemoglobin: 15 g/dL (ref 11.1–15.9)
Hepatitis B Surface Ag: NEGATIVE
Immature Grans (Abs): 0 10*3/uL (ref 0.0–0.1)
Immature Granulocytes: 0 %
Lymphocytes Absolute: 2.4 10*3/uL (ref 0.7–3.1)
Lymphs: 18 %
MCH: 32 pg (ref 26.6–33.0)
MCHC: 33.3 g/dL (ref 31.5–35.7)
MCV: 96 fL (ref 79–97)
Monocytes Absolute: 0.8 10*3/uL (ref 0.1–0.9)
Monocytes: 6 %
Neutrophils Absolute: 10 10*3/uL — ABNORMAL HIGH (ref 1.4–7.0)
Neutrophils: 75 %
Platelets: 260 10*3/uL (ref 150–450)
RBC: 4.69 x10E6/uL (ref 3.77–5.28)
RDW: 12.3 % (ref 11.7–15.4)
RPR Ser Ql: NONREACTIVE
Rh Factor: POSITIVE
Rubella Antibodies, IGG: 1.5 index (ref >0.99)
WBC: 13.5 10*3/uL — ABNORMAL HIGH (ref 3.4–10.8)

## 2019-12-14 LAB — VITAMIN D 25 HYDROXY (VIT D DEFICIENCY, FRACTURES): Vit D, 25-Hydroxy: 27.7 ng/mL — ABNORMAL LOW (ref 30.0–100.0)

## 2019-12-14 LAB — HCV INTERPRETATION

## 2019-12-14 LAB — HEMOGLOBIN A1C
Est. average glucose Bld gHb Est-mCnc: 97 mg/dL
Hgb A1c MFr Bld: 5 % (ref 4.8–5.6)

## 2019-12-15 LAB — HGB FRACTIONATION CASCADE
Hgb A2: 2.4 % (ref 1.8–3.2)
Hgb A: 97.3 % (ref 96.4–98.8)
Hgb F: 0.3 % (ref 0.0–2.0)
Hgb S: 0 %

## 2019-12-15 LAB — URINE CULTURE, OB REFLEX

## 2019-12-15 LAB — GC/CHLAMYDIA PROBE AMP (~~LOC~~) NOT AT ARMC
Chlamydia: NEGATIVE
Comment: NEGATIVE
Comment: NORMAL
Neisseria Gonorrhea: NEGATIVE

## 2019-12-15 LAB — CULTURE, OB URINE

## 2019-12-16 ENCOUNTER — Encounter: Payer: Self-pay | Admitting: Family Medicine

## 2019-12-16 DIAGNOSIS — O0991 Supervision of high risk pregnancy, unspecified, first trimester: Secondary | ICD-10-CM

## 2019-12-16 DIAGNOSIS — O99211 Obesity complicating pregnancy, first trimester: Secondary | ICD-10-CM

## 2019-12-20 ENCOUNTER — Encounter: Payer: Self-pay | Admitting: Radiology

## 2019-12-20 ENCOUNTER — Telehealth: Payer: Self-pay | Admitting: Radiology

## 2019-12-20 NOTE — Telephone Encounter (Signed)
Patient informed of Panorama and fetal sex results  

## 2019-12-21 ENCOUNTER — Other Ambulatory Visit: Payer: Self-pay | Admitting: *Deleted

## 2019-12-21 MED ORDER — PROMETHAZINE HCL 25 MG PO TABS: 25.0000 mg | ORAL_TABLET | Freq: Four times a day (QID) | ORAL | 1 refills | Status: DC | PRN

## 2019-12-21 MED ORDER — BUTALBITAL-APAP-CAFFEINE 50-325-40 MG PO CAPS: 1.0000 | ORAL_CAPSULE | Freq: Four times a day (QID) | ORAL | 1 refills | Status: DC | PRN

## 2020-01-11 ENCOUNTER — Ambulatory Visit (INDEPENDENT_AMBULATORY_CARE_PROVIDER_SITE_OTHER): Payer: 59 | Admitting: Family Medicine

## 2020-01-11 ENCOUNTER — Other Ambulatory Visit: Payer: Self-pay

## 2020-01-11 VITALS — BP 121/86 | HR 78 | Wt 200.3 lb

## 2020-01-11 DIAGNOSIS — E669 Obesity, unspecified: Secondary | ICD-10-CM

## 2020-01-11 DIAGNOSIS — O0991 Supervision of high risk pregnancy, unspecified, first trimester: Secondary | ICD-10-CM

## 2020-01-11 DIAGNOSIS — O99212 Obesity complicating pregnancy, second trimester: Secondary | ICD-10-CM

## 2020-01-11 DIAGNOSIS — O0992 Supervision of high risk pregnancy, unspecified, second trimester: Secondary | ICD-10-CM

## 2020-01-11 DIAGNOSIS — Z3A16 16 weeks gestation of pregnancy: Secondary | ICD-10-CM

## 2020-01-11 NOTE — Patient Instructions (Addendum)
 Breastfeeding  Choosing to breastfeed is one of the best decisions you can make for yourself and your baby. A change in hormones during pregnancy causes your breasts to make breast milk in your milk-producing glands. Hormones prevent breast milk from being released before your baby is born. They also prompt milk flow after birth. Once breastfeeding has begun, thoughts of your baby, as well as his or her sucking or crying, can stimulate the release of milk from your milk-producing glands. Benefits of breastfeeding Research shows that breastfeeding offers many health benefits for infants and mothers. It also offers a cost-free and convenient way to feed your baby. For your baby  Your first milk (colostrum) helps your baby's digestive system to function better.  Special cells in your milk (antibodies) help your baby to fight off infections.  Breastfed babies are less likely to develop asthma, allergies, obesity, or type 2 diabetes. They are also at lower risk for sudden infant death syndrome (SIDS).  Nutrients in breast milk are better able to meet your baby's needs compared to infant formula.  Breast milk improves your baby's brain development. For you  Breastfeeding helps to create a very special bond between you and your baby.  Breastfeeding is convenient. Breast milk costs nothing and is always available at the correct temperature.  Breastfeeding helps to burn calories. It helps you to lose the weight that you gained during pregnancy.  Breastfeeding makes your uterus return faster to its size before pregnancy. It also slows bleeding (lochia) after you give birth.  Breastfeeding helps to lower your risk of developing type 2 diabetes, osteoporosis, rheumatoid arthritis, cardiovascular disease, and breast, ovarian, uterine, and endometrial cancer later in life. Breastfeeding basics Starting breastfeeding  Find a comfortable place to sit or lie down, with your neck and back  well-supported.  Place a pillow or a rolled-up blanket under your baby to bring him or her to the level of your breast (if you are seated). Nursing pillows are specially designed to help support your arms and your baby while you breastfeed.  Make sure that your baby's tummy (abdomen) is facing your abdomen.  Gently massage your breast. With your fingertips, massage from the outer edges of your breast inward toward the nipple. This encourages milk flow. If your milk flows slowly, you may need to continue this action during the feeding.  Support your breast with 4 fingers underneath and your thumb above your nipple (make the letter "C" with your hand). Make sure your fingers are well away from your nipple and your baby's mouth.  Stroke your baby's lips gently with your finger or nipple.  When your baby's mouth is open wide enough, quickly bring your baby to your breast, placing your entire nipple and as much of the areola as possible into your baby's mouth. The areola is the colored area around your nipple. ? More areola should be visible above your baby's upper lip than below the lower lip. ? Your baby's lips should be opened and extended outward (flanged) to ensure an adequate, comfortable latch. ? Your baby's tongue should be between his or her lower gum and your breast.  Make sure that your baby's mouth is correctly positioned around your nipple (latched). Your baby's lips should create a seal on your breast and be turned out (everted).  It is common for your baby to suck about 2-3 minutes in order to start the flow of breast milk. Latching Teaching your baby how to latch onto your breast properly   is very important. An improper latch can cause nipple pain, decreased milk supply, and poor weight gain in your baby. Also, if your baby is not latched onto your nipple properly, he or she may swallow some air during feeding. This can make your baby fussy. Burping your baby when you switch breasts  during the feeding can help to get rid of the air. However, teaching your baby to latch on properly is still the best way to prevent fussiness from swallowing air while breastfeeding. Signs that your baby has successfully latched onto your nipple  Silent tugging or silent sucking, without causing you pain. Infant's lips should be extended outward (flanged).  Swallowing heard between every 3-4 sucks once your milk has started to flow (after your let-down milk reflex occurs).  Muscle movement above and in front of his or her ears while sucking. Signs that your baby has not successfully latched onto your nipple  Sucking sounds or smacking sounds from your baby while breastfeeding.  Nipple pain. If you think your baby has not latched on correctly, slip your finger into the corner of your baby's mouth to break the suction and place it between your baby's gums. Attempt to start breastfeeding again. Signs of successful breastfeeding Signs from your baby  Your baby will gradually decrease the number of sucks or will completely stop sucking.  Your baby will fall asleep.  Your baby's body will relax.  Your baby will retain a small amount of milk in his or her mouth.  Your baby will let go of your breast by himself or herself. Signs from you  Breasts that have increased in firmness, weight, and size 1-3 hours after feeding.  Breasts that are softer immediately after breastfeeding.  Increased milk volume, as well as a change in milk consistency and color by the fifth day of breastfeeding.  Nipples that are not sore, cracked, or bleeding. Signs that your baby is getting enough milk  Wetting at least 1-2 diapers during the first 24 hours after birth.  Wetting at least 5-6 diapers every 24 hours for the first week after birth. The urine should be clear or pale yellow by the age of 5 days.  Wetting 6-8 diapers every 24 hours as your baby continues to grow and develop.  At least 3 stools in  a 24-hour period by the age of 5 days. The stool should be soft and yellow.  At least 3 stools in a 24-hour period by the age of 7 days. The stool should be seedy and yellow.  No loss of weight greater than 10% of birth weight during the first 3 days of life.  Average weight gain of 4-7 oz (113-198 g) per week after the age of 4 days.  Consistent daily weight gain by the age of 5 days, without weight loss after the age of 2 weeks. After a feeding, your baby may spit up a small amount of milk. This is normal. Breastfeeding frequency and duration Frequent feeding will help you make more milk and can prevent sore nipples and extremely full breasts (breast engorgement). Breastfeed when you feel the need to reduce the fullness of your breasts or when your baby shows signs of hunger. This is called "breastfeeding on demand." Signs that your baby is hungry include:  Increased alertness, activity, or restlessness.  Movement of the head from side to side.  Opening of the mouth when the corner of the mouth or cheek is stroked (rooting).  Increased sucking sounds, smacking lips,   cooing, sighing, or squeaking.  Hand-to-mouth movements and sucking on fingers or hands.  Fussing or crying. Avoid introducing a pacifier to your baby in the first 4-6 weeks after your baby is born. After this time, you may choose to use a pacifier. Research has shown that pacifier use during the first year of a baby's life decreases the risk of sudden infant death syndrome (SIDS). Allow your baby to feed on each breast as long as he or she wants. When your baby unlatches or falls asleep while feeding from the first breast, offer the second breast. Because newborns are often sleepy in the first few weeks of life, you may need to awaken your baby to get him or her to feed. Breastfeeding times will vary from baby to baby. However, the following rules can serve as a guide to help you make sure that your baby is properly  fed:  Newborns (babies 4 weeks of age or younger) may breastfeed every 1-3 hours.  Newborns should not go without breastfeeding for longer than 3 hours during the day or 5 hours during the night.  You should breastfeed your baby a minimum of 8 times in a 24-hour period. Breast milk pumping     Pumping and storing breast milk allows you to make sure that your baby is exclusively fed your breast milk, even at times when you are unable to breastfeed. This is especially important if you go back to work while you are still breastfeeding, or if you are not able to be present during feedings. Your lactation consultant can help you find a method of pumping that works best for you and give you guidelines about how long it is safe to store breast milk. Caring for your breasts while you breastfeed Nipples can become dry, cracked, and sore while breastfeeding. The following recommendations can help keep your breasts moisturized and healthy:  Avoid using soap on your nipples.  Wear a supportive bra designed especially for nursing. Avoid wearing underwire-style bras or extremely tight bras (sports bras).  Air-dry your nipples for 3-4 minutes after each feeding.  Use only cotton bra pads to absorb leaked breast milk. Leaking of breast milk between feedings is normal.  Use lanolin on your nipples after breastfeeding. Lanolin helps to maintain your skin's normal moisture barrier. Pure lanolin is not harmful (not toxic) to your baby. You may also hand express a few drops of breast milk and gently massage that milk into your nipples and allow the milk to air-dry. In the first few weeks after giving birth, some women experience breast engorgement. Engorgement can make your breasts feel heavy, warm, and tender to the touch. Engorgement peaks within 3-5 days after you give birth. The following recommendations can help to ease engorgement:  Completely empty your breasts while breastfeeding or pumping. You may  want to start by applying warm, moist heat (in the shower or with warm, water-soaked hand towels) just before feeding or pumping. This increases circulation and helps the milk flow. If your baby does not completely empty your breasts while breastfeeding, pump any extra milk after he or she is finished.  Apply ice packs to your breasts immediately after breastfeeding or pumping, unless this is too uncomfortable for you. To do this: ? Put ice in a plastic bag. ? Place a towel between your skin and the bag. ? Leave the ice on for 20 minutes, 2-3 times a day.  Make sure that your baby is latched on and positioned properly while breastfeeding.   If engorgement persists after 48 hours of following these recommendations, contact your health care provider or a lactation consultant. Overall health care recommendations while breastfeeding  Eat 3 healthy meals and 3 snacks every day. Well-nourished mothers who are breastfeeding need an additional 450-500 calories a day. You can meet this requirement by increasing the amount of a balanced diet that you eat.  Drink enough water to keep your urine pale yellow or clear.  Rest often, relax, and continue to take your prenatal vitamins to prevent fatigue, stress, and low vitamin and mineral levels in your body (nutrient deficiencies).  Do not use any products that contain nicotine or tobacco, such as cigarettes and e-cigarettes. Your baby may be harmed by chemicals from cigarettes that pass into breast milk and exposure to secondhand smoke. If you need help quitting, ask your health care provider.  Avoid alcohol.  Do not use illegal drugs or marijuana.  Talk with your health care provider before taking any medicines. These include over-the-counter and prescription medicines as well as vitamins and herbal supplements. Some medicines that may be harmful to your baby can pass through breast milk.  It is possible to become pregnant while breastfeeding. If birth  control is desired, ask your health care provider about options that will be safe while breastfeeding your baby. Where to find more information: [employer]: www.llli.org Contact a health care provider if:  You feel like you want to stop breastfeeding or have become frustrated with breastfeeding.  Your nipples are cracked or bleeding.  Your breasts are red, tender, or warm.  You have: ? Painful breasts or nipples. ? A swollen area on either breast. ? A fever or chills. ? Nausea or vomiting. ? Drainage other than breast milk from your nipples.  Your breasts do not become full before feedings by the fifth day after you give birth.  You feel sad and depressed.  Your baby is: ? Too sleepy to eat well. ? Having trouble sleeping. ? More than 1 week old and wetting fewer than 6 diapers in a 24-hour period. ? Not gaining weight by 5 days of age.  Your baby has fewer than 3 stools in a 24-hour period.  Your baby's skin or the white parts of his or her eyes become yellow. Get help right away if:  Your baby is overly tired (lethargic) and does not want to wake up and feed.  Your baby develops an unexplained fever. Summary  Breastfeeding offers many health benefits for infant and mothers.  Try to breastfeed your infant when he or she shows early signs of hunger.  Gently tickle or stroke your baby's lips with your finger or nipple to allow the baby to open his or her mouth. Bring the baby to your breast. Make sure that much of the areola is in your baby's mouth. Offer one side and burp the baby before you offer the other side.  Talk with your health care provider or lactation consultant if you have questions or you face problems as you breastfeed. This information is not intended to replace advice given to you by your health care provider. Make sure you discuss any questions you have with your health care provider. Document Revised: 09/18/2017 Document Reviewed:  07/26/2016 Elsevier Patient Education  2020 Elsevier Inc. AREA PEDIATRIC/FAMILY PRACTICE PHYSICIANS  [place] ([address]) . [facility] o Chambliss, MD; Eniola, MD; Hale, MD; Hensel, MD; McDiarmid, MD; McIntyer, MD; Neal, MD; Walden, MD o [address] [place], [place] [address] o [phone] o Mon-Fri 8:30-12:30, 1:30-5:00 o Providers come to see babies at [facility] o Accepting Medicaid . [facility] o Limited providers who accept newborns: Koirala, MD; Morrow, MD; Wolters, MD o [address], [place], [place] [address] o [phone] o Mon-Fri 8:00-5:30 o Babies seen by providers at [facility] o Does NOT accept Medicaid o Please call early in hospitalization for appointment (limited availability)  . [facility] o Mulberry, MD o [address] [place], [place] [address] o [phone] o Mon, Tue, Thur, Fri 8:30-5:00, Wed 10:00-7:00 (closed 1-2pm) o Babies seen by [facility] providers o Accepting Medicaid . [facility] o Rubin, MD o [address], [place], [place] [address] o [phone] o Mon-Fri 8:30-5:00, Sat 8:30-12:00 o Provider comes to see babies at [facility] o Accepting Medicaid o Must have been referred from current patients or contacted office prior to delivery . [facility] ([facility]) o Brown, MD; Chandler, MD; Ettefagh, MD; Grant, MD; Lester, MD; McCormick, MD; McQueen, MD; Prose, MD; Simha, MD; Stanley, MD; Stryffeler, NP; Tebben, NP o [address], [place], [place] [address] o [phone] o Mon, Tue, Thur, Fri 8:30-5:30, Wed 9:30-5:30, Sat 8:30-12:30 o Babies seen by [facility] providers o Accepting Medicaid o Only accepting infants of first-time parents or siblings of current patients o Hospital discharge coordinator will make  follow-up appointment . Jack Amos o [address], [place], [place]  [address] o [phone]   Fax - [phone] . [facility] o [address], [place], [place]  [address] o Phone - [phone]   Fax - [phone] . Shilpa Gosrani o [address], [place], [place]  [address] o [phone]  [place] ([address]) . [facility] o Bates, MD; Brassfield, MD; Cooper, Cox, MD; MD; Davis, MD; Dovico, MD; Ettefaugh, MD; Little, MD; Lowe, MD; Keiffer, MD; Melvin, MD; Sumner, MD; Williams, MD o [address], [place], [place] [address] o [phone] o Mon-Fri 8:30-5:00 (extended evenings Mon-Thur as needed), Sat-Sun 10:00-1:00 o Providers come to see babies at [facility] o Accepting Medicaid for families of first-time babies and families with all children in the household age 3 and under. Must register with office prior to making appointment (M-F only). . [facility] o Henson, NP; Knapp, MD; Lalonde, MD; Tysinger, PA o [address] [place], [place] [address] o [phone] o Mon-Fri 8:00-5:00 o Babies seen by providers at [facility] o Does NOT accept Medicaid/Commercial Insurance Only . [facility] ([facility])  o Artis, MD; Barnes, MD; Bratton, MD; Coccaro, MD; Lockett Gardner, MD; Kramer, MD; Marshall, MD; Netherton, MD; Poleto, MD; Skinner, MD o [address] [place], [place] [address] o [phone] o Mon-Fri 8:30-5:30, Sat (Oct.-Mar.) 9:00-1:00 o Babies seen by providers at [facility] o Accepting Medicaid  [place] ([address]) . [facility] o Reid, MD; Warner, MD o [address], [place], [place] [address] o [phone] o Mon-Fri 8:30-5:00, Sat 8:30-12:00 o Providers come to see babies at [facility] o Does NOT accept Medicaid . [facility] o Becker, PA; Hagler, MD; Scifres,  PA; Sun, MD; Swayne, MD o [address], [place], [place] [address] o [phone] o Mon-Fri 8:00-5:00 o Babies seen by providers at [facility] o   Does NOT accept Medicaid o Only accepting babies of parents who are patients o Please call early in hospitalization for appointment (limited availability) . [facility] o Clark, MD; Frye, MD; Kelleher, MD; Mack, NP; Miller, MD; O'Keller, MD; Patterson, NP; Pudlo, MD; Puzio, MD; Thomas, MD; Tucker, MD; Twiselton, MD o [address], [place], [place] [address] o [phone] o Mon-Fri 8:00-5:00, Sat 9:00-12:00 o Providers come to see babies at [facility] o Does NOT accept Medicaid  [place] ([address]) . [facility] o Limited providers accepting new patients: Brake, NP; Wharton, PA o [address], [place], [place] [address] o [phone] o Mon-Fri 8:00-5:00 o Babies seen by providers at [facility] o Does NOT accept Medicaid o Only accepting babies of parents who are patients o Please call early in hospitalization for appointment (limited availability) . [facility] o Gay, MD; Quinlan, MD o [address] [place], [place] [address] o [phone] (press 1 to schedule appointment) o Mon-Fri 8:00-5:00 o Providers come to see babies at [facility] o Does NOT accept Medicaid . [facility] o Mazer, MD o [address] [place], [place] [address] o [phone] o Mon-Fri 8:30-5:00 (lunch 12:30-1:00), extended hours by appointment only Wed 5:00-6:30 o Babies seen by [facility] providers o Accepting Medicaid . [facility] o Banks, MD; Jordan, MD; Koberlein, MD o [address], [place], [place] [address] o [phone] o Mon-Fri 8:00-5:00 o Babies seen by [facility] providers o Does NOT accept Medicaid . [facility] o Parker, MD; Hunter, MD; Wallace,  DO o [address] [place], [place] [address] o [phone] o Mon-Fri 8:00-5:00 o Babies seen by [facility] providers o Does NOT accept Medicaid . [facility] o Brandon, PA; Brecken, PA; Christy, NP; Dees, MD; DeClaire, MD; DeWeese, MD; Hansen, NP; Mills, NP; Parrish, NP; Smoot, NP; Summer, MD; Vapne, MD o [address] [place], [place] [address] o [phone] o Mon-Fri 8:30-5:00, Sat 10:00-1:00 o Providers come to see babies at [facility] o Does NOT accept Medicaid o Free prenatal information session Tuesdays at 4:45pm . [facility] o Bouska, MD; Gordon, PA; Jeffery, PA; Weber, PA o [address] [place] [address] o [phone] o Mon-Fri 7:30-5:30 o Babies seen by [facility] providers . [facility] o [address], [place], [place]  [address] o [phone]   Fax - [phone]  [place] ([address] & [address]) . [facility] o [place]ese, MD o [address] [place], [place] [address] o [phone] o Mon-Thur 8:00-6:00 o Providers come to see babies at [facility] o Accepting Medicaid . [facility] o Anderson, NP; Badger, MD; Wyman, PA; Spencer, PA o [address] [place], [place] [address] o [phone] o Mon-Thur 7:30-7:30, Fri 7:30-4:30 o Babies seen by [facility] providers o Accepting Medicaid . [facility] o Agbuya, MD; Klett, NP; Romgoolam, MD o [address], [place], [place] [address] o [phone] o Mon-Fri 8:30-5:00, Sat 8:30-12:00 o Providers come to see babies at [facility] o Accepting Medicaid o Must have "Meet & Greet" appointment at office prior to delivery . [facility] o McCord, MD; Wallace, MD; Wood, MD o [address], , [place] [address] o [phone] o Mon-Wed 8:00-6:00, Thur-Fri  8:00-5:00, Sat 9:00-12:00 o Providers come to see babies at [facility] o   Does NOT accept Medicaid o Only accepting siblings of current patients . [facility]  o [address], [place], [place]  [address] o [phone]   Fax - [phone] . [facility] o [address], [place], [place]  [address] o [phone]   Fax - [phone]  [place] ([address] & [address]) . [facility] o Cirigliano, DO; Matthews, DO o [address] [place], [place] [address] o [phone] o Mon-Fri 7:00-5:00 o Babies seen by [facility] providers o Does NOT accept Medicaid . [facility] o Briscoe, MD; Howley, PA; Moreira, PA o [address], [place], [place] [address] o [phone] o Mon-Fri 8:00-5:00 o Babies seen by [facility] providers o Accepting Medicaid . [facility] o Boyd, MD; Church, PA; Jones, NP; Osborn, PA o [address], [place], [place] [address] o [phone] o Mon-Fri 8:00-5:00 o Babies seen by providers at [facility] o Accepting Medicaid  [place] ([address]) . [facility] [place] o Wendling, DO o [address] [place], [place] [address] o [phone] o Mon-Fri 8:00-5:00 o Babies seen by [facility] providers o Does NOT accept Medicaid o Limited availability, please call early in hospitalization to schedule follow-up . [facility] o Calderon, PA; Cummings, MD; Dillard, MD; Martin, PA; Olson, MD; VanDeven, PA o [address], [place], [place] [address] o [phone] o Mon-Fri 8:30-5:00, Sat 9:00-12:00 o Babies seen by providers at [facility] o Accepting Medicaid o Please register online then schedule online or call office o [email or web address] . [facility] o Hunter, NP; Kumar, MD; Martin Rogers, PA o [address], [place], [place] [address] o [phone] o Mon-Fri 8:00-5:00 o Babies seen by providers at [facility] o Accepting Medicaid . [facility] ([facility] o Merrimack, MD; Kristi Fleenor, NP; West, MD o [address], [place], [place] [address] o [phone] o Mon-Fri 8:00-5:30, Sat&Sun by appointment (phones open at 8:30) o Babies seen by [facility] providers o Accepting Medicaid o Must be a first-time baby or sibling of current patient . [facility] [place]  o [address], [place], [place]  [address] o [phone]   Fax - [phone]  [place] ([address]) . [facility] o Brown, PA; Cowen, PA; Rice, MD; [place], PA; Spry, MD o [phone] [address] [place], [place] [address] o [phone] o Mon-Thur 8:00-7:00, Fri 8:00-5:00, Sat 8:00-12:00, Sun 9:00-12:00 o Babies seen by [facility] providers o Accepting Medicaid . [facility] [place] o Coe-Goins, MD; Marshall, MD; Pierre-Louis, MD o [address], [place], [place] [address] o [phone] o Mon-Thur 8:00-5:00 o Babies seen by providers at [facility] o Accepting Medicaid . [facility] [place] o Bratton, MD; Coe-Goins, MD; Hayes, MD; Lewis, MD; List, MD; Lott, MD; Marshall, MD; Moran, MD; O'Neal, MD; Pierre-Louis, MD; Pitonzo, MD; Scholer, MD; Spangle, MD o [address] [place],  [address] o [phone] o Mon-Fri 8:00-5:30, Sat (Oct.-Mar.) 9:00-1:00 o Babies seen by providers at [facility] o Accepting Medicaid o Must fill out new patient packet, available online at [email or web address] . [facility]   [address] ([facility] [address]) o Friddle,  NP; Harris, NP; Kelly, NP; Logan, MD; Melvin, PA; Poth, MD; Ramadoss, MD; Stanton, NP o [address], [place], [place] [address] o [phone] o Mon-Thur 8:00-5:30, Fri 8:00-5:00 o Babies seen by providers at [facility] o Accepting Medicaid  [place] ([address]) . [facility] o Dixon, PA; [place], MD; Pickard, MD; Tapia, PA o [address], [place], [place] [address] o [phone] o Mon-Fri 8:00-5:00 o Babies seen by providers at [facility] o Accepting Medicaid   [place] ([address]) . [facility] [place] o Masneri, DO; Meyers, MD; Nelson, PA o [address], [place], [place] [address] o [phone] o Mon-Fri 8:00-5:00 o Babies seen by providers at [facility] o Does NOT accept Medicaid o Limited appointment availability, please call early in hospitalization  . [facility] [place] o Kunedd, DO; McGowen, MD o [address], [place], [place] [address] o [phone] o Mon-Fri 8:00-5:00 o Babies seen by [facility] providers o Does NOT accept Medicaid . [facility] [place] o Cameron, MD; MacDonald, MD; Michaels, PA; Nayak, MD o [address] [place] [address], [place], [place] [address] o [phone] o Mon-Fri 8:00-5:00 o After hours clinic ([address] [place], [place] [address]) [phone] Mon-Fri 5:00-8:00, Sat 12:00-6:00, Sun 10:00-4:00 o Babies seen by [facility] providers o Accepting Medicaid . [facility] [place] o [address], [place], [place]  [address] o [phone]   Fax - [phone]  [place] ([address]) . [facility] [place] o Andy, MD o [address], [place], [place] [address] o [phone] o Mon-Fri 8:00-5:00 o Babies seen by [facility] providers o Does NOT accept Medicaid . [facility] [place] ([facility] [place]) o Eksir, MD o [address], [place], [place] [address] o [phone] o Mon-Thur 8:00-7:00, Fri 8:00-5:00, Sat 8:00-12:00 o Babies seen by providers at [facility] o Accepting Medicaid - but does not have vaccinations in office (must be received elsewhere) o Limited availability, please call early in hospitalization  [place] ([address]) . [facility]  o Charlene Flemming, MD o [address],   [address] o [phone]  Fax [phone]   

## 2020-01-11 NOTE — Progress Notes (Signed)
    PRENATAL VISIT NOTE  Subjective:  Elizabeth [name] is a 29 y.o. G1P0000 at [redacted]w[redacted]d being seen today for ongoing prenatal care.  She is currently monitored for the following issues for this low-risk pregnancy and has Vitamin D deficiency; Supervision of high risk pregnancy in first trimester; and Obesity affecting pregnancy in first trimester on their problem list.  Patient reports cramping.   .  .  Movement: Absent. Denies leaking of fluid.   The following portions of the patient's history were reviewed and updated as appropriate: allergies, current medications, past family history, past medical history, past social history, past surgical history and problem list.   Objective:   Vitals:   01/11/20 0837  BP: 121/86  Pulse: 78  Weight: 200 lb 4.8 oz (90.9 kg)    Fetal Status: Fetal Heart Rate (bpm): 141   Movement: Absent     General:  Alert, oriented and cooperative. Patient is in no acute distress.  Skin: Skin is warm and dry. No rash noted.   Cardiovascular: Normal heart rate noted  Respiratory: Normal respiratory effort, no problems with respiration noted  Abdomen: Soft, gravid, appropriate for gestational age.  Pain/Pressure: Absent     Pelvic: Cervical exam deferred        Extremities: Normal range of motion.     Mental Status: Normal mood and affect. Normal behavior. Normal judgment and thought content.   Assessment and Plan:  Pregnancy: G1P0000 at [redacted]w[redacted]d 1. Supervision of high risk pregnancy in first trimester AFP today RLP discussed Normal pregnancy and warning signs discussed. - AFP, Serum, Open Spina Bifida  General obstetric precautions including but not limited to vaginal bleeding, contractions, leaking of fluid and fetal movement were reviewed in detail with the patient. Please refer to After Visit Summary for other counseling recommendations.   Return in 4 weeks (on 02/08/2020) for virtual.  Future Appointments  Date Time Provider Department Center  01/31/2020  8:45  AM [facility] US5 [facility]  02/08/2020  9:45 AM Sedillo Bing, MD [facility]    Reva Bores, MD

## 2020-01-11 NOTE — Progress Notes (Signed)
Would like a list of peds providers in the area

## 2020-01-13 LAB — AFP, SERUM, OPEN SPINA BIFIDA
AFP MoM: 1.22
AFP Value: 37 ng/mL
Gest. Age on Collection Date: 16.4 weeks
Maternal Age At EDD: 29.4 yr
OSBR Risk 1 IN: 6110
Test Results:: NEGATIVE
Weight: 200 [lb_av]

## 2020-01-31 ENCOUNTER — Ambulatory Visit: Payer: 59 | Attending: Obstetrics and Gynecology

## 2020-01-31 ENCOUNTER — Other Ambulatory Visit: Payer: Self-pay

## 2020-01-31 DIAGNOSIS — E669 Obesity, unspecified: Secondary | ICD-10-CM

## 2020-01-31 DIAGNOSIS — Z3A19 19 weeks gestation of pregnancy: Secondary | ICD-10-CM

## 2020-01-31 DIAGNOSIS — Z363 Encounter for antenatal screening for malformations: Secondary | ICD-10-CM

## 2020-01-31 DIAGNOSIS — O0991 Supervision of high risk pregnancy, unspecified, first trimester: Secondary | ICD-10-CM | POA: Diagnosis not present

## 2020-01-31 DIAGNOSIS — O99212 Obesity complicating pregnancy, second trimester: Secondary | ICD-10-CM

## 2020-02-08 ENCOUNTER — Other Ambulatory Visit: Payer: Self-pay

## 2020-02-08 ENCOUNTER — Telehealth (INDEPENDENT_AMBULATORY_CARE_PROVIDER_SITE_OTHER): Payer: 59 | Admitting: Obstetrics and Gynecology

## 2020-02-08 VITALS — BP 122/74 | Wt 207.0 lb

## 2020-02-08 DIAGNOSIS — Z3A20 20 weeks gestation of pregnancy: Secondary | ICD-10-CM

## 2020-02-08 DIAGNOSIS — Z3482 Encounter for supervision of other normal pregnancy, second trimester: Secondary | ICD-10-CM

## 2020-02-08 NOTE — Progress Notes (Signed)
I connected with  Elizabeth [name] on 02/08/20 at  9:45 AM EDT by telephone and verified that I am speaking with the correct person using two identifiers.   I discussed the limitations, risks, security and privacy concerns of performing an evaluation and management service by telephone and the availability of in person appointments. I also discussed with the patient that there may be a patient responsible charge related to this service. The patient expressed understanding and agreed to proceed.  Scheryl Marten, RN 02/08/2020  9:37 AM

## 2020-02-08 NOTE — Progress Notes (Signed)
° °  TELEHEALTH VIRTUAL OBSTETRICS VISIT ENCOUNTER NOTE  [facility]inic: [facility]  I connected with Elizabeth [name] on 02/08/20 at  9:45 AM EDT by telephone at home and verified that I am speaking with the correct person using two identifiers.   I discussed the limitations, risks, security and privacy concerns of performing an evaluation and management service by telephone and the availability of in person appointments. I also discussed with the patient that there may be a patient responsible charge related to this service. The patient expressed understanding and agreed to proceed.  Subjective:  Elizabeth [name] is a 29 y.o. G1P0000 at [redacted]w[redacted]d being followed for ongoing prenatal care.  She is currently monitored for the following issues for this low-risk pregnancy and has Vitamin D deficiency; Supervision of high risk pregnancy in first trimester; and Obesity affecting pregnancy in first trimester on their problem list.  Patient reports sleep and urinary frequency. Reports fetal movement. Denies any contractions, bleeding or leaking of fluid.   The following portions of the patient's history were reviewed and updated as appropriate: allergies, current medications, past family history, past medical history, past social history, past surgical history and problem list.   Objective:   Vitals:   02/08/20 0936  BP: 122/74  Weight: 207 lb (93.9 kg)    Babyscripts Data Reviewed: yes  General:  Alert, oriented and cooperative.   Mental Status: Normal mood and affect perceived. Normal judgment and thought content.  Rest of physical exam deferred due to type of encounter  Assessment and Plan:  Pregnancy: G1P0000 at [redacted]w[redacted]d 1. Encounter for supervision of other normal pregnancy in second trimester Routine care. Neg anatomy u/s Recommend sleep pillow, unisom/melatonin. Also humidifier, saline mist for stuffy nose at night No overt s/s of UTI but recommend coming by to drop off urine for u-dip  and behavioral measures. No late night caffeine or drinking.   Preterm labor symptoms and general obstetric precautions including but not limited to vaginal bleeding, contractions, leaking of fluid and fetal movement were reviewed in detail with the patient.  I discussed the assessment and treatment plan with the patient. The patient was provided an opportunity to ask questions and all were answered. The patient agreed with the plan and demonstrated an understanding of the instructions. The patient was advised to call back or seek an in-person office evaluation/go to MAU at [facility] for any urgent or concerning symptoms. Please refer to After Visit Summary for other counseling recommendations.   I provided 7 minutes of non-face-to-face time during this encounter. The visit was conducted via MyChart-medicine  Return in about 1 month (around 03/10/2020) for low risk, in person or virtual.  No future appointments.  Wynne Bing, MD Center for [employer], [facility]

## 2020-03-07 ENCOUNTER — Telehealth (INDEPENDENT_AMBULATORY_CARE_PROVIDER_SITE_OTHER): Payer: 59 | Admitting: Obstetrics and Gynecology

## 2020-03-07 ENCOUNTER — Encounter: Payer: Self-pay | Admitting: Obstetrics and Gynecology

## 2020-03-07 ENCOUNTER — Other Ambulatory Visit: Payer: Self-pay

## 2020-03-07 VITALS — BP 118/76 | HR 76 | Wt 212.6 lb

## 2020-03-07 DIAGNOSIS — O0991 Supervision of high risk pregnancy, unspecified, first trimester: Secondary | ICD-10-CM

## 2020-03-07 DIAGNOSIS — O99211 Obesity complicating pregnancy, first trimester: Secondary | ICD-10-CM | POA: Diagnosis not present

## 2020-03-07 NOTE — Progress Notes (Signed)
   OBSTETRICS PRENATAL VIRTUAL VISIT ENCOUNTER NOTE  Provider location: [facility]   I connected with Elizabeth [name] on 03/07/20 at  9:45 AM EDT by MyChart Video Encounter at home and verified that I am speaking with the correct person using two identifiers.   I discussed the limitations, risks, security and privacy concerns of performing an evaluation and management service virtually and the availability of in person appointments. I also discussed with the patient that there may be a patient responsible charge related to this service. The patient expressed understanding and agreed to proceed. Subjective:  Elizabeth [name] is a 29 y.o. G1P0000 at [redacted]w[redacted]d being seen today for ongoing prenatal care.  She is currently monitored for the following issues for this low-risk pregnancy and has Vitamin D deficiency; Supervision of high risk pregnancy in first trimester; and Obesity affecting pregnancy in first trimester on their problem list.  Patient reports no complaints.  Contractions: Not present.  .  Movement: Present. Denies any leaking of fluid.   The following portions of the patient's history were reviewed and updated as appropriate: allergies, current medications, past family history, past medical history, past social history, past surgical history and problem list.   Objective:   Vitals:   03/07/20 0913  BP: 118/76  Pulse: 76  Weight: 212 lb 9.6 oz (96.4 kg)    Fetal Status:     Movement: Present     General:  Alert, oriented and cooperative. Patient is in no acute distress.  Respiratory: Normal respiratory effort, no problems with respiration noted  Mental Status: Normal mood and affect. Normal behavior. Normal judgment and thought content.  Rest of physical exam deferred due to type of encounter  Imaging: No results found.  Assessment and Plan:  Pregnancy: G1P0000 at [redacted]w[redacted]d 1. Supervision of high risk pregnancy in first trimester Patient is doing well  without complaints Third trimester labs with glucola next visit Patient planning IUD for contraception and desires post placental placement  2. Obesity affecting pregnancy in first trimester Continue ASA  Preterm labor symptoms and general obstetric precautions including but not limited to vaginal bleeding, contractions, leaking of fluid and fetal movement were reviewed in detail with the patient. I discussed the assessment and treatment plan with the patient. The patient was provided an opportunity to ask questions and all were answered. The patient agreed with the plan and demonstrated an understanding of the instructions. The patient was advised to call back or seek an in-person office evaluation/go to MAU at [facility] for any urgent or concerning symptoms. Please refer to After Visit Summary for other counseling recommendations.   I provided 10 minutes of face-to-face time during this encounter.  Return in about 4 weeks (around 04/04/2020) for in person, ROB, Low risk, 2 hr glucola next visit.  Future Appointments  Date Time Provider Department Center  03/07/2020  9:45 AM Elizabeth [name], Gigi Gin, MD [facility]  04/04/2020  8:45 AM Reva Bores, MD [facility]    Catalina Antigua, MD Center for [facility], [facility]

## 2020-04-04 ENCOUNTER — Ambulatory Visit (INDEPENDENT_AMBULATORY_CARE_PROVIDER_SITE_OTHER): Payer: 59 | Admitting: Family Medicine

## 2020-04-04 ENCOUNTER — Other Ambulatory Visit: Payer: Self-pay

## 2020-04-04 VITALS — BP 111/78 | HR 99 | Wt 221.0 lb

## 2020-04-04 DIAGNOSIS — Z3A28 28 weeks gestation of pregnancy: Secondary | ICD-10-CM

## 2020-04-04 DIAGNOSIS — Z23 Encounter for immunization: Secondary | ICD-10-CM

## 2020-04-04 DIAGNOSIS — O0991 Supervision of high risk pregnancy, unspecified, first trimester: Secondary | ICD-10-CM

## 2020-04-04 DIAGNOSIS — O99211 Obesity complicating pregnancy, first trimester: Secondary | ICD-10-CM

## 2020-04-04 LAB — CBC
Hematocrit: 39.9 % (ref 34.0–46.6)
Hemoglobin: 13.2 g/dL (ref 11.1–15.9)
MCH: 32.2 pg (ref 26.6–33.0)
MCHC: 33.1 g/dL (ref 31.5–35.7)
MCV: 97 fL (ref 79–97)
Platelets: 195 10*3/uL (ref 150–450)
RBC: 4.1 x10E6/uL (ref 3.77–5.28)
RDW: 12.9 % (ref 11.7–15.4)
WBC: 14.1 10*3/uL — ABNORMAL HIGH (ref 3.4–10.8)

## 2020-04-04 NOTE — Patient Instructions (Signed)

## 2020-04-04 NOTE — Progress Notes (Signed)
   PRENATAL VISIT NOTE  Subjective:  Elizabeth [name] is a 29 y.o. G1P0000 at [redacted]w[redacted]d being seen today for ongoing prenatal care.  She is currently monitored for the following issues for this low-risk pregnancy and has Vitamin D deficiency; Supervision of high risk pregnancy in first trimester; and Obesity affecting pregnancy in first trimester on their problem list.  Patient reports no complaints.  Contractions: Not present. Vag. Bleeding: None.  Movement: Present. Denies leaking of fluid.   The following portions of the patient's history were reviewed and updated as appropriate: allergies, current medications, past family history, past medical history, past social history, past surgical history and problem list.   Objective:   Vitals:   04/04/20 0903  BP: 111/78  Pulse: 99  Weight: 221 lb (100.2 kg)    Fetal Status: Fetal Heart Rate (bpm): 154 Fundal Height: 27 cm Movement: Present     General:  Alert, oriented and cooperative. Patient is in no acute distress.  Skin: Skin is warm and dry. No rash noted.   Cardiovascular: Normal heart rate noted  Respiratory: Normal respiratory effort, no problems with respiration noted  Abdomen: Soft, gravid, appropriate for gestational age.  Pain/Pressure: Absent     Pelvic: Cervical exam deferred        Extremities: Normal range of motion.  Edema: None  Mental Status: Normal mood and affect. Normal behavior. Normal judgment and thought content.   Assessment and Plan:  Pregnancy: G1P0000 at [redacted]w[redacted]d 1. Supervision of high risk pregnancy in first trimester 28 wk labs and TDaP today Considering Flu shot Usual discomforts of pregnancy discussed at length - Glucose Tolerance, 2 Hours w/1 Hour - CBC - RPR - HIV Antibody (routine testing w rflx)  2. Obesity affecting pregnancy in first trimester 29 Lb TWG--advised to limit carbs  3. COVID-19 Vaccine Counseling: The patient was counseled on the potential benefits and lack of known risks of COVID  vaccination, during pregnancy and breastfeeding, during today's visit. The patient's questions and concerns were addressed today.  The patient has been made aware that although she is not at increased risk of contracting COVID-19 during pregnancy, she is at increased risk of developing severe disease and complications if she contracts COVID-19 while pregnant. All patient questions were addressed during our visit today. The patient is not planning to get vaccinated at this time. She had COVID previously.    Preterm labor symptoms and general obstetric precautions including but not limited to vaginal bleeding, contractions, leaking of fluid and fetal movement were reviewed in detail with the patient. Please refer to After Visit Summary for other counseling recommendations.   Return in 2 weeks (on 04/18/2020) for virtual.  Future Appointments  Date Time Provider Department Center  04/18/2020 10:00 AM [place] Bing, MD [facility]    Elizabeth Bores, MD

## 2020-04-05 LAB — HIV ANTIBODY (ROUTINE TESTING W REFLEX): HIV Screen 4th Generation wRfx: NONREACTIVE

## 2020-04-05 LAB — RPR: RPR Ser Ql: NONREACTIVE

## 2020-04-05 LAB — GLUCOSE TOLERANCE, 2 HOURS W/ 1HR
Glucose, 1 hour: 130 mg/dL (ref 65–179)
Glucose, 2 hour: 90 mg/dL (ref 65–152)
Glucose, Fasting: 77 mg/dL (ref 65–91)

## 2020-04-18 ENCOUNTER — Telehealth (INDEPENDENT_AMBULATORY_CARE_PROVIDER_SITE_OTHER): Payer: 59 | Admitting: Obstetrics and Gynecology

## 2020-04-18 VITALS — BP 118/70 | HR 86

## 2020-04-18 DIAGNOSIS — Z3A30 30 weeks gestation of pregnancy: Secondary | ICD-10-CM

## 2020-04-18 DIAGNOSIS — Z3493 Encounter for supervision of normal pregnancy, unspecified, third trimester: Secondary | ICD-10-CM

## 2020-04-18 MED ORDER — FAMOTIDINE 20 MG PO TABS: 20.0000 mg | ORAL_TABLET | Freq: Two times a day (BID) | ORAL | 1 refills | Status: DC

## 2020-04-18 NOTE — Progress Notes (Signed)
   TELEHEALTH VIRTUAL OBSTETRICS VISIT ENCOUNTER NOTE  [facility]inic: [facility]  I connected with [name] on 04/18/20 at 10:00 AM EDT by telephone at home and verified that I am speaking with the correct person using two identifiers.   I discussed the limitations, risks, security and privacy concerns of performing an evaluation and management service by telephone and the availability of in person appointments. I also discussed with the patient that there may be a patient responsible charge related to this service. The patient expressed understanding and agreed to proceed.  Subjective:  Elizabeth [name] is a 29 y.o. G1P0000 at [redacted]w[redacted]d being followed for ongoing prenatal care.  She is currently monitored for the following issues for this low-risk pregnancy and has Vitamin D deficiency; Supervision of high risk pregnancy, antepartum; and Obesity affecting pregnancy in first trimester on their problem list.  Patient reports pregnancy aches and pains. Reports fetal movement. Denies any contractions, bleeding or leaking of fluid.   The following portions of the patient's history were reviewed and updated as appropriate: allergies, current medications, past family history, past medical history, past social history, past surgical history and problem list.   Objective:   Vitals:   04/18/20 1003  BP: 118/70  Pulse: 86    Babyscripts Data Reviewed: yes  General:  Alert, oriented and cooperative.   Mental Status: Normal mood and affect perceived. Normal judgment and thought content.  Rest of physical exam deferred due to type of encounter  Assessment and Plan:  Pregnancy: G1P0000 at [redacted]w[redacted]d 1. [redacted] weeks gestation of pregnancy Weight goals d/w her. Recommend interventions to help with ergonomics (pt sits at desk all day for work)  Preterm labor symptoms and general obstetric precautions including but not limited to vaginal bleeding, contractions, leaking of fluid and fetal movement were  reviewed in detail with the patient.  I discussed the assessment and treatment plan with the patient. The patient was provided an opportunity to ask questions and all were answered. The patient agreed with the plan and demonstrated an understanding of the instructions. The patient was advised to call back or seek an in-person office evaluation/go to MAU at [facility] for any urgent or concerning symptoms. Please refer to After Visit Summary for other counseling recommendations.   I provided 7 minutes of non-face-to-face time during this encounter. The visit was conducted via MyChart-medicine  No follow-ups on file.  Future Appointments  Date Time Provider Department Center  05/03/2020  3:45 PM Calvert Cantor, CNM [facility]    Courtland Bing, MD Center for [employer], [facility]

## 2020-04-25 ENCOUNTER — Encounter (HOSPITAL_COMMUNITY): Payer: Self-pay | Admitting: Obstetrics and Gynecology

## 2020-04-25 ENCOUNTER — Other Ambulatory Visit: Payer: Self-pay

## 2020-04-25 ENCOUNTER — Inpatient Hospital Stay (HOSPITAL_BASED_OUTPATIENT_CLINIC_OR_DEPARTMENT_OTHER): Payer: 59

## 2020-04-25 ENCOUNTER — Inpatient Hospital Stay (HOSPITAL_COMMUNITY)
Admission: AD | Admit: 2020-04-25 | Discharge: 2020-04-25 | Disposition: A | Discharge status: Home / Self Care | Payer: 59 | Attending: Obstetrics and Gynecology | Admitting: Obstetrics and Gynecology

## 2020-04-25 DIAGNOSIS — Z3A31 31 weeks gestation of pregnancy: Secondary | ICD-10-CM | POA: Diagnosis not present

## 2020-04-25 DIAGNOSIS — O4690 Antepartum hemorrhage, unspecified, unspecified trimester: Secondary | ICD-10-CM

## 2020-04-25 DIAGNOSIS — Z7982 Long term (current) use of aspirin: Secondary | ICD-10-CM | POA: Diagnosis not present

## 2020-04-25 DIAGNOSIS — O4693 Antepartum hemorrhage, unspecified, third trimester: Secondary | ICD-10-CM

## 2020-04-25 DIAGNOSIS — O26853 Spotting complicating pregnancy, third trimester: Secondary | ICD-10-CM | POA: Diagnosis present

## 2020-04-25 DIAGNOSIS — O26893 Other specified pregnancy related conditions, third trimester: Secondary | ICD-10-CM | POA: Insufficient documentation

## 2020-04-25 DIAGNOSIS — Z362 Encounter for other antenatal screening follow-up: Secondary | ICD-10-CM | POA: Diagnosis not present

## 2020-04-25 DIAGNOSIS — R030 Elevated blood-pressure reading, without diagnosis of hypertension: Secondary | ICD-10-CM | POA: Diagnosis not present

## 2020-04-25 DIAGNOSIS — O99891 Other specified diseases and conditions complicating pregnancy: Secondary | ICD-10-CM

## 2020-04-25 DIAGNOSIS — R102 Pelvic and perineal pain: Secondary | ICD-10-CM | POA: Insufficient documentation

## 2020-04-25 DIAGNOSIS — I10 Essential (primary) hypertension: Secondary | ICD-10-CM

## 2020-04-25 DIAGNOSIS — Z79899 Other long term (current) drug therapy: Secondary | ICD-10-CM | POA: Diagnosis not present

## 2020-04-25 LAB — COMPREHENSIVE METABOLIC PANEL
ALT: 15 U/L (ref 0–44)
AST: 16 U/L (ref 15–41)
Albumin: 3 g/dL — ABNORMAL LOW (ref 3.5–5.0)
Alkaline Phosphatase: 89 U/L (ref 38–126)
Anion gap: 10 (ref 5–15)
BUN: 6 mg/dL (ref 6–20)
CO2: 21 mmol/L — ABNORMAL LOW (ref 22–32)
Calcium: 8.6 mg/dL — ABNORMAL LOW (ref 8.9–10.3)
Chloride: 101 mmol/L (ref 98–111)
Creatinine, Ser: 0.56 mg/dL (ref 0.44–1.00)
GFR, Estimated: >60 mL/min (ref >60)
Glucose, Bld: 89 mg/dL (ref 70–99)
Potassium: 3.5 mmol/L (ref 3.5–5.1)
Sodium: 132 mmol/L — ABNORMAL LOW (ref 135–145)
Total Bilirubin: 0.4 mg/dL (ref 0.3–1.2)
Total Protein: 6.3 g/dL — ABNORMAL LOW (ref 6.5–8.1)

## 2020-04-25 LAB — URINALYSIS, ROUTINE W REFLEX MICROSCOPIC
Bilirubin Urine: NEGATIVE
Glucose, UA: NEGATIVE mg/dL
Hgb urine dipstick: NEGATIVE
Ketones, ur: NEGATIVE mg/dL
Leukocytes,Ua: NEGATIVE
Nitrite: NEGATIVE
Protein, ur: NEGATIVE mg/dL
Specific Gravity, Urine: 1.005 (ref 1.005–1.030)
pH: 6 (ref 5.0–8.0)

## 2020-04-25 LAB — WET PREP, GENITAL
Clue Cells Wet Prep HPF POC: NONE SEEN
Sperm: NONE SEEN
Trich, Wet Prep: NONE SEEN
Yeast Wet Prep HPF POC: NONE SEEN

## 2020-04-25 LAB — CBC
HCT: 38.4 % (ref 36.0–46.0)
Hemoglobin: 12.6 g/dL (ref 12.0–15.0)
MCH: 31.6 pg (ref 26.0–34.0)
MCHC: 32.8 g/dL (ref 30.0–36.0)
MCV: 96.2 fL (ref 80.0–100.0)
Platelets: 190 10*3/uL (ref 150–400)
RBC: 3.99 MIL/uL (ref 3.87–5.11)
RDW: 13.5 % (ref 11.5–15.5)
WBC: 15.4 10*3/uL — ABNORMAL HIGH (ref 4.0–10.5)
nRBC: 0 % (ref 0.0–0.2)

## 2020-04-25 LAB — PROTEIN / CREATININE RATIO, URINE
Creatinine, Urine: 39.45 mg/dL
Total Protein, Urine: <6 mg/dL

## 2020-04-25 NOTE — MAU Note (Signed)
Pt reports she noticed one spot of pinkish discharge on her pantyliner. Some cramping off/on since yesterday. No blood on tissue when wiping. Reports good fetal movement. Last intercourse 4 days ago.

## 2020-04-25 NOTE — MAU Provider Note (Addendum)
Chief Complaint:  Vaginal Bleeding   First Provider Initiated Contact with Patient 04/25/20 1956     HPI: Elizabeth [name] is a 29 y.o. G1P0000 at [redacted]w[redacted]d who presents to maternity admissions reporting one episode of pink spotting in her underwear this afternoon. She thinks she had some brown discharge yesterday but is not sure. She has had some mild cramping today but no tightness or Braxton-Hicks. She called into her office 04/19/20 reporting abnormal discharge she thinks might be BV. Denies vaginal bleeding, leaking of fluid, decreased fetal movement, fever, falls, or recent illness.   Past Medical History:  Diagnosis Date  . Anxiety    OB History  Gravida Para Term Preterm AB Living  1 0 0 0 0 0  SAB TAB Ectopic Multiple Live Births  0 0 0 0 0    # Outcome Date GA Lbr Len/2nd Weight Sex Delivery Anes PTL Lv  1 Current            Past Surgical History:  Procedure Laterality Date  . WISDOM TOOTH EXTRACTION     Family History  Problem Relation Age of Onset  . Cancer Maternal Aunt    Social History   Tobacco Use  . Smoking status: Never Smoker  . Smokeless tobacco: Never Used  Substance Use Topics  . Alcohol use: Not Currently    Comment: social  . Drug use: No   No Known Allergies Medications Prior to Admission  Medication Sig Dispense Refill Last Dose  . aspirin EC 81 MG tablet Take 81 mg by mouth daily. Swallow whole.   04/25/2020 at Unknown time  . famotidine (PEPCID) 20 MG tablet Take 1 tablet (20 mg total) by mouth 2 (two) times daily. 60 tablet 1 04/25/2020 at Unknown time  . Prenatal Vit-Fe Fumarate-FA (MULTIVITAMIN-PRENATAL) 27-0.8 MG TABS tablet Take 1 tablet by mouth daily at 12 noon.   04/25/2020 at Unknown time  . Vitamin D, Cholecalciferol, 50 MCG (2000 UT) CAPS Take by mouth.   04/25/2020 at Unknown time  . Butalbital-APAP-Caffeine 50-325-40 MG capsule Take 1-2 capsules by mouth every 6 (six) hours as needed for headache. (Patient not taking: Reported on 01/11/2020)  30 capsule 1   . promethazine (PHENERGAN) 25 MG tablet Take 1 tablet (25 mg total) by mouth every 6 (six) hours as needed for nausea or vomiting. (Patient not taking: Reported on 01/11/2020) 30 tablet 1     I have reviewed patient's Past Medical Hx, Surgical Hx, Family Hx, Social Hx, medications and allergies.   ROS:  Review of Systems  Constitutional: Negative for fatigue and fever.  HENT: Negative for congestion and sore throat.   Respiratory: Negative for shortness of breath.   Gastrointestinal: Negative for nausea.  Genitourinary: Positive for pelvic pain (occasional cramping, no tightness), vaginal bleeding (pink) and vaginal discharge (increased x1wk).  Neurological: Negative for dizziness and syncope.  All other systems reviewed and are negative.   Physical Exam   Patient Vitals for the past 24 hrs:  BP Temp Temp src Pulse Resp SpO2 Height Weight  04/25/20 2001 127/65 -- -- (!) 113 -- -- -- --  04/25/20 1931 137/79 -- -- (!) 124 -- -- -- --  04/25/20 1912 (!) 145/85 99.1 F (37.3 C) Oral (!) 128 17 99 % 5\' 4"  (1.626 m) 227 lb (103 kg)    Constitutional: Well-developed, well-nourished female in no acute distress.  Cardiovascular: normal rate & rhythm, no murmur Respiratory: normal effort, lung sounds clear throughout GI: Abd soft, non-tender, gravid  appropriate for gestational age. Pos BS x 4 MS: Extremities nontender, no edema, normal ROM Neurologic: Alert and oriented x 4.  Pelvic: NEFG, physiologic discharge (off-white discharge noted), no blood.  Dilation: Closed Effacement (%): Thick Cervical Position: Posterior Station: Ballotable Presentation: Vertex Exam by:: Elizabeth [name], CNM  Fetal Tracing: reactive Baseline: 145 Variability: moderate Accelerations: present Decelerations: none Toco: UI   Labs: Results for orders placed or performed during the hospital encounter of 04/25/20 (from the past 24 hour(s))  Urinalysis, Routine w reflex microscopic Urine,  Clean Catch     Status: Abnormal   Collection Time: 04/25/20  7:16 PM  Result Value Ref Range   Color, Urine STRAW (A) YELLOW   APPearance CLEAR CLEAR   Specific Gravity, Urine 1.005 1.005 - 1.030   pH 6.0 5.0 - 8.0   Glucose, UA NEGATIVE NEGATIVE mg/dL   Hgb urine dipstick NEGATIVE NEGATIVE   Bilirubin Urine NEGATIVE NEGATIVE   Ketones, ur NEGATIVE NEGATIVE mg/dL   Protein, ur NEGATIVE NEGATIVE mg/dL   Nitrite NEGATIVE NEGATIVE   Leukocytes,Ua NEGATIVE NEGATIVE  Protein / creatinine ratio, urine     Status: None   Collection Time: 04/25/20  7:39 PM  Result Value Ref Range   Creatinine, Urine 39.45 mg/dL   Total Protein, Urine <6.0 mg/dL   Protein Creatinine Ratio        0.00 - 0.15 mg/mg[Cre]  CBC     Status: Abnormal   Collection Time: 04/25/20  7:48 PM  Result Value Ref Range   WBC 15.4 (H) 4.0 - 10.5 K/uL   RBC 3.99 3.87 - 5.11 MIL/uL   Hemoglobin 12.6 12.0 - 15.0 g/dL   HCT 76.1 36 - 46 %   MCV 96.2 80.0 - 100.0 fL   MCH 31.6 26.0 - 34.0 pg   MCHC 32.8 30.0 - 36.0 g/dL   RDW 95.0 93.2 - 67.1 %   Platelets 190 150 - 400 K/uL   nRBC 0.0 0.0 - 0.2 %  Comprehensive metabolic panel     Status: Abnormal   Collection Time: 04/25/20  7:48 PM  Result Value Ref Range   Sodium 132 (L) 135 - 145 mmol/L   Potassium 3.5 3.5 - 5.1 mmol/L   Chloride 101 98 - 111 mmol/L   CO2 21 (L) 22 - 32 mmol/L   Glucose, Bld 89 70 - 99 mg/dL   BUN 6 6 - 20 mg/dL   Creatinine, Ser 2.45 0.44 - 1.00 mg/dL   Calcium 8.6 (L) 8.9 - 10.3 mg/dL   Total Protein 6.3 (L) 6.5 - 8.1 g/dL   Albumin 3.0 (L) 3.5 - 5.0 g/dL   [name] 16 15 - 41 U/L   ALT 15 0 - 44 U/L   Alkaline Phosphatase 89 38 - 126 U/L   Total Bilirubin 0.4 0.3 - 1.2 mg/dL   GFR, Estimated >80 >99 mL/min   Anion gap 10 5 - 15  Wet prep, genital     Status: Abnormal   Collection Time: 04/25/20  8:16 PM  Result Value Ref Range   Yeast Wet Prep HPF POC NONE SEEN NONE SEEN   Trich, Wet Prep NONE SEEN NONE SEEN   Clue Cells Wet Prep HPF  POC NONE SEEN NONE SEEN   WBC, Wet Prep HPF POC MANY (A) NONE SEEN   Sperm NONE SEEN    Imaging:  No results found.  MAU Course: Orders Placed This Encounter  Procedures  . Wet prep, genital  . Urinalysis, Routine w reflex  microscopic Urine, Clean Catch  . CBC  . Comprehensive metabolic panel  . Protein / creatinine ratio, urine   MDM: Preeclampsia workup normal Wet prep/UA normal Ultrasound pending  Care turned over to Elizabeth Ast, NP at 2124  Speculum exam performed, cervix appears visually closed, non-friable cervix, no bleeding noted on exam. [place] order placed. Report given and care turned over to Elizabeth [name], CNM.  Nugent, Elizabeth Sera, NP  9:43 PM 04/25/2020  [place] completed Results reviewed, no sign of abruption, placenta posterior AFI 11.5 (27%ile) Had one single elevated BP, no others Discussed we will keep an eye on it with future visits.    Reviewed with patient Cannot ascertain for certain where bleeding came from Reviewed bleeding precautions Reviewed preeclampsia [name] Followup in office as scheduled Encouraged to return here or to other Urgent Care/ED if she develops worsening of symptoms, increase in pain, fever, or other concerning symptoms.    Elizabeth [name], CNM

## 2020-04-25 NOTE — Discharge Instructions (Signed)
Third Trimester of Pregnancy The third trimester is from week 28 through week 40 (months 7 through 9). The third trimester is a time when the unborn baby (fetus) is growing rapidly. At the end of the ninth month, the fetus is about 20 inches in length and weighs 6-10 pounds. Body changes during your third trimester Your body will continue to go through many changes during pregnancy. The changes vary from woman to woman. During the third trimester:  Your weight will continue to increase. You can expect to gain 25-35 pounds (11-16 kg) by the end of the pregnancy.  You may begin to get stretch marks on your hips, abdomen, and breasts.  You may urinate more often because the fetus is moving lower into your pelvis and pressing on your bladder.  You may develop or continue to have heartburn. This is caused by increased hormones that slow down muscles in the digestive tract.  You may develop or continue to have constipation because increased hormones slow digestion and cause the muscles that push waste through your intestines to relax.  You may develop hemorrhoids. These are swollen veins (varicose veins) in the rectum that can itch or be painful.  You may develop swollen, bulging veins (varicose veins) in your legs.  You may have increased body aches in the pelvis, back, or thighs. This is due to weight gain and increased hormones that are relaxing your joints.  You may have changes in your hair. These can include thickening of your hair, rapid growth, and changes in texture. Some women also have hair loss during or after pregnancy, or hair that feels dry or thin. Your hair will most likely return to normal after your baby is born.  Your breasts will continue to grow and they will continue to become tender. A yellow fluid (colostrum) may leak from your breasts. This is the first milk you are producing for your baby.  Your belly button may stick out.  You may notice more swelling in your hands,  face, or ankles.  You may have increased tingling or numbness in your hands, arms, and legs. The skin on your belly may also feel numb.  You may feel short of breath because of your expanding uterus.  You may have more problems sleeping. This can be caused by the size of your belly, increased need to urinate, and an increase in your body's metabolism.  You may notice the fetus "dropping," or moving lower in your abdomen (lightening).  You may have increased vaginal discharge.  You may notice your joints feel loose and you may have pain around your pelvic bone. What to expect at prenatal visits You will have prenatal exams every 2 weeks until week 36. Then you will have weekly prenatal exams. During a routine prenatal visit:  You will be weighed to make sure you and the baby are growing normally.  Your blood pressure will be taken.  Your abdomen will be measured to track your baby's growth.  The fetal heartbeat will be listened to.  Any test results from the previous visit will be discussed.  You may have a cervical check near your due date to see if your cervix has softened or thinned (effaced).  You will be tested for Group B streptococcus. This happens between 35 and 37 weeks. Your health care provider may ask you:  What your birth plan is.  How you are feeling.  If you are feeling the baby move.  If you have had any abnormal   symptoms, such as leaking fluid, bleeding, severe headaches, or abdominal cramping.  If you are using any tobacco products, including cigarettes, chewing tobacco, and electronic cigarettes.  If you have any questions. Other tests or screenings that may be performed during your third trimester include:  Blood tests that check for low iron levels (anemia).  Fetal testing to check the health, activity level, and growth of the fetus. Testing is done if you have certain medical conditions or if there are problems during the pregnancy.  Nonstress test  (NST). This test checks the health of your baby to make sure there are no signs of problems, such as the baby not getting enough oxygen. During this test, a belt is placed around your belly. The baby is made to move, and its heart rate is monitored during movement. What is false labor? False labor is a condition in which you feel small, irregular tightenings of the muscles in the womb (contractions) that usually go away with rest, changing position, or drinking water. These are called Braxton Hicks contractions. Contractions may last for hours, days, or even weeks before true labor sets in. If contractions come at regular intervals, become more frequent, increase in intensity, or become painful, you should see your health care provider. What are the signs of labor?  Abdominal cramps.  Regular contractions that start at 10 minutes apart and become stronger and more frequent with time.  Contractions that start on the top of the uterus and spread down to the lower abdomen and back.  Increased pelvic pressure and dull back pain.  A watery or bloody mucus discharge that comes from the vagina.  Leaking of amniotic fluid. This is also known as your "water breaking." It could be a slow trickle or a gush. Let your health care provider know if it has a color or strange odor. If you have any of these signs, call your health care provider right away, even if it is before your due date. Follow these instructions at home: Medicines  Follow your health care provider's instructions regarding medicine use. Specific medicines may be either safe or unsafe to take during pregnancy.  Take a prenatal vitamin that contains at least 600 micrograms (mcg) of folic acid.  If you develop constipation, try taking a stool softener if your health care provider approves. Eating and drinking   Eat a balanced diet that includes fresh fruits and vegetables, whole grains, good sources of protein such as meat, eggs, or tofu,  and low-fat dairy. Your health care provider will help you determine the amount of weight gain that is right for you.  Avoid raw meat and uncooked cheese. These carry germs that can cause birth defects in the baby.  If you have low calcium intake from food, talk to your health care provider about whether you should take a daily calcium supplement.  Eat four or five small meals rather than three large meals a day.  Limit foods that are high in fat and processed sugars, such as fried and sweet foods.  To prevent constipation: ? Drink enough fluid to keep your urine clear or pale yellow. ? Eat foods that are high in fiber, such as fresh fruits and vegetables, whole grains, and beans. Activity  Exercise only as directed by your health care provider. Most women can continue their usual exercise routine during pregnancy. Try to exercise for 30 minutes at least 5 days a week. Stop exercising if you experience uterine contractions.  Avoid heavy lifting.  Do   not exercise in extreme heat or humidity, or at high altitudes.  Wear low-heel, comfortable shoes.  Practice good posture.  You may continue to have sex unless your health care provider tells you otherwise. Relieving pain and discomfort  Take frequent breaks and rest with your legs elevated if you have leg cramps or low back pain.  Take warm sitz baths to soothe any pain or discomfort caused by hemorrhoids. Use hemorrhoid cream if your health care provider approves.  Wear a good support bra to prevent discomfort from breast tenderness.  If you develop varicose veins: ? Wear support pantyhose or compression stockings as told by your healthcare provider. ? Elevate your feet for 15 minutes, 3-4 times a day. Prenatal care  Write down your questions. Take them to your prenatal visits.  Keep all your prenatal visits as told by your health care provider. This is important. Safety  Wear your seat belt at all times when driving.  Make  a list of emergency phone numbers, including numbers for family, friends, the hospital, and police and fire departments. General instructions  Avoid cat litter boxes and soil used by cats. These carry germs that can cause birth defects in the baby. If you have a cat, ask someone to clean the litter box for you.  Do not travel far distances unless it is absolutely necessary and only with the approval of your health care provider.  Do not use hot tubs, steam rooms, or saunas.  Do not drink alcohol.  Do not use any products that contain nicotine or tobacco, such as cigarettes and e-cigarettes. If you need help quitting, ask your health care provider.  Do not use any medicinal herbs or unprescribed drugs. These chemicals affect the formation and growth of the baby.  Do not douche or use tampons or scented sanitary pads.  Do not cross your legs for long periods of time.  To prepare for the arrival of your baby: ? Take prenatal classes to understand, practice, and ask questions about labor and delivery. ? Make a trial run to the hospital. ? Visit the hospital and tour the maternity area. ? Arrange for maternity or paternity leave through employers. ? Arrange for family and friends to take care of pets while you are in the hospital. ? Purchase a rear-facing car seat and make sure you know how to install it in your car. ? Pack your hospital bag. ? Prepare the baby's nursery. Make sure to remove all pillows and stuffed animals from the baby's crib to prevent suffocation.  Visit your dentist if you have not gone during your pregnancy. Use a soft toothbrush to brush your teeth and be gentle when you floss. Contact a health care provider if:  You are unsure if you are in labor or if your water has broken.  You become dizzy.  You have mild pelvic cramps, pelvic pressure, or nagging pain in your abdominal area.  You have lower back pain.  You have persistent nausea, vomiting, or  diarrhea.  You have an unusual or bad smelling vaginal discharge.  You have pain when you urinate. Get help right away if:  Your water breaks before 37 weeks.  You have regular contractions less than 5 minutes apart before 37 weeks.  You have a fever.  You are leaking fluid from your vagina.  You have spotting or bleeding from your vagina.  You have severe abdominal pain or cramping.  You have rapid weight loss or weight gain.  You have   shortness of breath with chest pain.  You notice sudden or extreme swelling of your face, hands, ankles, feet, or legs.  Your baby makes fewer than 10 movements in 2 hours.  You have severe headaches that do not go away when you take medicine.  You have vision changes. Summary  The third trimester is from week 28 through week 40, months 7 through 9. The third trimester is a time when the unborn baby (fetus) is growing rapidly.  During the third trimester, your discomfort may increase as you and your baby continue to gain weight. You may have abdominal, leg, and back pain, sleeping problems, and an increased need to urinate.  During the third trimester your breasts will keep growing and they will continue to become tender. A yellow fluid (colostrum) may leak from your breasts. This is the first milk you are producing for your baby.  False labor is a condition in which you feel small, irregular tightenings of the muscles in the womb (contractions) that eventually go away. These are called Braxton Hicks contractions. Contractions may last for hours, days, or even weeks before true labor sets in.  Signs of labor can include: abdominal cramps; regular contractions that start at 10 minutes apart and become stronger and more frequent with time; watery or bloody mucus discharge that comes from the vagina; increased pelvic pressure and dull back pain; and leaking of amniotic fluid. This information is not intended to replace advice given to you by your  health care provider. Make sure you discuss any questions you have with your health care provider. Document Revised: 10/15/2018 Document Reviewed: 07/30/2016 Elsevier Patient Education  2020 [employer]  

## 2020-05-03 ENCOUNTER — Other Ambulatory Visit: Payer: Self-pay

## 2020-05-03 ENCOUNTER — Ambulatory Visit (INDEPENDENT_AMBULATORY_CARE_PROVIDER_SITE_OTHER): Payer: 59 | Admitting: Advanced Practice Midwife

## 2020-05-03 VITALS — BP 119/77 | HR 98 | Wt 228.0 lb

## 2020-05-03 DIAGNOSIS — R102 Pelvic and perineal pain: Secondary | ICD-10-CM

## 2020-05-03 DIAGNOSIS — O26893 Other specified pregnancy related conditions, third trimester: Secondary | ICD-10-CM

## 2020-05-03 DIAGNOSIS — Z3483 Encounter for supervision of other normal pregnancy, third trimester: Secondary | ICD-10-CM

## 2020-05-03 DIAGNOSIS — F419 Anxiety disorder, unspecified: Secondary | ICD-10-CM | POA: Insufficient documentation

## 2020-05-03 NOTE — Progress Notes (Addendum)
   PRENATAL VISIT NOTE  Subjective:  Elizabeth [name] is a 29 y.o. G1P0000 at [redacted]w[redacted]d being seen today for ongoing prenatal care.  She is currently monitored for the following issues for this low-risk pregnancy and has Vitamin D deficiency; Supervision of high risk pregnancy, antepartum; Obesity affecting pregnancy in first trimester; and Anxiety on their problem list.  Patient reports backache and fatigue. She works in a medical office with lots of sitting. Unable to take stretch breaks due to high census, busy practice.   Contractions: Not present. Vag. Bleeding: None.  Movement: Present. Denies leaking of fluid.   The following portions of the patient's history were reviewed and updated as appropriate: allergies, current medications, past family history, past medical history, past social history, past surgical history and problem list. Problem list updated.  Objective:   Vitals:   05/03/20 1541  BP: 119/77  Pulse: 98  Weight: 228 lb (103.4 kg)    Fetal Status: Fetal Heart Rate (bpm): 147 Fundal Height: 33 cm Movement: Present     General:  Alert, oriented and cooperative. Patient is in no acute distress.  Skin: Skin is warm and dry. No rash noted.   Cardiovascular: Normal heart rate noted  Respiratory: Normal respiratory effort, no problems with respiration noted  Abdomen: Soft, gravid, appropriate for gestational age.  Pain/Pressure: Present     Pelvic: Cervical exam deferred        Extremities: Normal range of motion.  Edema: Trace  Mental Status: Normal mood and affect. Normal behavior. Normal judgment and thought content.   Assessment and Plan:  Pregnancy: G1P0000 at [redacted]w[redacted]d  1. Encounter for supervision of other normal pregnancy in third trimester - Routine care. FH appropriate  2. Pelvic pain affecting pregnancy in third trimester, antepartum - Reassured symptoms are within normal for third trimester - Advised talking to work team about stretch breaks - Discussed more supportive  shoes (not crocs) esp at work - Advised compression socks - Consider Tylenol 650 mg q 4-6 hours, continue maternity belt  Preterm labor symptoms and general obstetric precautions including but not limited to vaginal bleeding, contractions, leaking of fluid and fetal movement were reviewed in detail with the patient. Please refer to After Visit Summary for other counseling recommendations.  Return in about 2 weeks (around 05/17/2020) for Virtual.  Future Appointments  Date Time Provider Department Center  05/17/2020  4:20 PM Calvert Cantor, [place] [facility]  05/31/2020  3:30 PM Calvert Cantor, CNM [facility]    Calvert Cantor, [place]

## 2020-05-03 NOTE — Patient Instructions (Signed)
Braxton Hicks Contractions °Contractions of the uterus can occur throughout pregnancy, but they are not always a sign that you are in labor. You may have practice contractions called Braxton Hicks contractions. These false labor contractions are sometimes confused with true labor. °What are Braxton Hicks contractions? °Braxton Hicks contractions are tightening movements that occur in the muscles of the uterus before labor. Unlike true labor contractions, these contractions do not result in opening (dilation) and thinning of the cervix. Toward the end of pregnancy (32-34 weeks), Braxton Hicks contractions can happen more often and may become stronger. These contractions are sometimes difficult to tell apart from true labor because they can be very uncomfortable. You should not feel embarrassed if you go to the hospital with false labor. °Sometimes, the only way to tell if you are in true labor is for your health care provider to look for changes in the cervix. The health care provider will do a physical exam and may monitor your contractions. If you are not in true labor, the exam should show that your cervix is not dilating and your water has not broken. °If there are no other health problems associated with your pregnancy, it is completely safe for you to be sent home with false labor. You may continue to have Braxton Hicks contractions until you go into true labor. °How to tell the difference between true labor and false labor °True labor °· Contractions last 30-70 seconds. °· Contractions become very regular. °· Discomfort is usually felt in the top of the uterus, and it spreads to the lower abdomen and low back. °· Contractions do not go away with walking. °· Contractions usually become more intense and increase in frequency. °· The cervix dilates and gets thinner. °False labor °· Contractions are usually shorter and not as strong as true labor contractions. °· Contractions are usually irregular. °· Contractions  are often felt in the front of the lower abdomen and in the groin. °· Contractions may go away when you walk around or change positions while lying down. °· Contractions get weaker and are shorter-lasting as time goes on. °· The cervix usually does not dilate or become thin. °Follow these instructions at home: ° °· Take over-the-counter and prescription medicines only as told by your health care provider. °· Keep up with your usual exercises and follow other instructions from your health care provider. °· Eat and drink lightly if you think you are going into labor. °· If Braxton Hicks contractions are making you uncomfortable: °? Change your position from lying down or resting to walking, or change from walking to resting. °? Sit and rest in a tub of warm water. °? Drink enough fluid to keep your urine pale yellow. Dehydration may cause these contractions. °? Do slow and deep breathing several times an hour. °· Keep all follow-up prenatal visits as told by your health care provider. This is important. °Contact a health care provider if: °· You have a fever. °· You have continuous pain in your abdomen. °Get help right away if: °· Your contractions become stronger, more regular, and closer together. °· You have fluid leaking or gushing from your vagina. °· You pass blood-tinged mucus (bloody show). °· You have bleeding from your vagina. °· You have low back pain that you never had before. °· You feel your baby’s head pushing down and causing pelvic pressure. °· Your baby is not moving inside you as much as it used to. °Summary °· Contractions that occur before labor are   called Braxton Hicks contractions, false labor, or practice contractions. °· Braxton Hicks contractions are usually shorter, weaker, farther apart, and less regular than true labor contractions. True labor contractions usually become progressively stronger and regular, and they become more frequent. °· Manage discomfort from Braxton Hicks contractions  by changing position, resting in a warm bath, drinking plenty of water, or practicing deep breathing. °This information is not intended to replace advice given to you by your health care provider. Make sure you discuss any questions you have with your health care provider. °Document Revised: 06/06/2017 Document Reviewed: 11/07/2016 °Elsevier Patient Education © 2020 [employer] ° °

## 2020-05-09 ENCOUNTER — Other Ambulatory Visit: Payer: Self-pay

## 2020-05-09 ENCOUNTER — Ambulatory Visit (INDEPENDENT_AMBULATORY_CARE_PROVIDER_SITE_OTHER): Payer: 59 | Admitting: Obstetrics and Gynecology

## 2020-05-09 ENCOUNTER — Encounter: Payer: Self-pay | Admitting: Radiology

## 2020-05-09 VITALS — BP 122/82 | HR 114

## 2020-05-09 DIAGNOSIS — O4700 False labor before 37 completed weeks of gestation, unspecified trimester: Secondary | ICD-10-CM | POA: Diagnosis not present

## 2020-05-09 DIAGNOSIS — Z3A33 33 weeks gestation of pregnancy: Secondary | ICD-10-CM

## 2020-05-09 MED ORDER — ZOLPIDEM TARTRATE 5 MG PO TABS: 5.0000 mg | ORAL_TABLET | ORAL | 1 refills | Status: DC | PRN

## 2020-05-09 NOTE — Progress Notes (Signed)
Cramping and low back pain since about 10pm last night, denies vaginal bleeding or leaking fluid. Has not taken anything for it. Trying to increase fluid intake.

## 2020-05-09 NOTE — Progress Notes (Signed)
  Prenatal Visit Note-Work In Visit Date: 05/09/2020 Clinic: [facility]  CC: pain, pressure, UCs since 2200 yesterday  Subjective:  Elizabeth [name] is a 29 y.o. G1P0000 at [redacted]w[redacted]d being seen today for ongoing prenatal care.  She is currently monitored for the following issues for this low-risk pregnancy and has Vitamin D deficiency; Supervision of high risk pregnancy, antepartum; Obesity affecting pregnancy in first trimester; and Anxiety on their problem list.  Patient sent message to clinic and worked in for visit this morning  No VB, LOF, decreased FM. Has constant discomfort and unsure of how frequent contractions.   Contractions: Irregular. Vag. Bleeding: None.  Movement: Present. Denies leaking of fluid.   The following portions of the patient's history were reviewed and updated as appropriate: allergies, current medications, past family history, past medical history, past social history, past surgical history and problem list. Problem list updated.  Objective:   Vitals:   05/09/20 0931  BP: 122/82  Pulse: (!) 114   FHR: 155 baseline, +accels, no decel, mod variability Toco: occasional UCs, +irritability x 66m  Fetal Status: Fetal Heart Rate (bpm): rNST   Movement: Present     General:  Alert, oriented and cooperative. Patient is in no acute distress.  Skin: Skin is warm and dry. No rash noted.   Cardiovascular: Normal heart rate noted  Respiratory: Normal respiratory effort, no problems with respiration noted  Abdomen: Soft, gravid, appropriate for gestational age. Pain/Pressure: Present     Pelvic:  Cervical exam performed Dilation: Closed Effacement (%): Thick Station: Ballotable  EBGUS normal Vagina: normal d/c, no VB or LOF Cervix: visually closed  SVE: cl/long/high/hard/barrel shapped  Extremities: Normal range of motion.     Mental Status: Normal mood and affect. Normal behavior. Normal judgment and thought content.   Urinalysis:       Assessment and Plan:  Pregnancy: G1P0000 at [redacted]w[redacted]d  1. Preterm contractions Reactive NST. Reassuring cervix is so closed with s/s since last night. Pt hasn't slept much recently. Urine looks clear and well hydrated and no e/o infection.  Recommend taking the rest of the day off and getting sleep. One time ambien dose given. If s/s continue tonight, since 24hrs and ongoing, recommend MAU evaluation. Hopefully taking the day off and getting some rest will help  2. [redacted] weeks gestation of pregnancy  Preterm labor symptoms and general obstetric precautions including but not limited to vaginal bleeding, contractions, leaking of fluid and fetal movement were reviewed in detail with the patient. Please refer to After Visit Summary for other counseling recommendations.  Return if symptoms worsen or fail to improve.   [place], MD

## 2020-05-10 ENCOUNTER — Other Ambulatory Visit: Payer: Self-pay | Admitting: Obstetrics and Gynecology

## 2020-05-17 ENCOUNTER — Other Ambulatory Visit: Payer: Self-pay

## 2020-05-17 ENCOUNTER — Telehealth (INDEPENDENT_AMBULATORY_CARE_PROVIDER_SITE_OTHER): Payer: 59 | Admitting: Advanced Practice Midwife

## 2020-05-17 VITALS — BP 124/72 | HR 86

## 2020-05-17 DIAGNOSIS — Z3483 Encounter for supervision of other normal pregnancy, third trimester: Secondary | ICD-10-CM | POA: Diagnosis not present

## 2020-05-17 DIAGNOSIS — Z3A34 34 weeks gestation of pregnancy: Secondary | ICD-10-CM | POA: Diagnosis not present

## 2020-05-18 NOTE — Progress Notes (Signed)
° °  OBSTETRICS PRENATAL VIRTUAL VISIT ENCOUNTER NOTE  Provider location: [facility] for [facility]   I connected with Elizabeth [name] on 05/18/20 at  4:20 PM EST by MyChart Video Encounter at home and verified that I am speaking with the correct person using two identifiers.   I discussed the limitations, risks, security and privacy concerns of performing an evaluation and management service virtually and the availability of in person appointments. I also discussed with the patient that there may be a patient responsible charge related to this service. The patient expressed understanding and agreed to proceed. Subjective:  Elizabeth [name] is a 29 y.o. G1P0000 at [redacted]w[redacted]d being seen today for ongoing prenatal care.  She is currently monitored for the following issues for this low-risk pregnancy and has Vitamin D deficiency; Supervision of high risk pregnancy, antepartum; Obesity affecting pregnancy in first trimester; and Anxiety on their problem list.  Patient reports no complaints.  Contractions: Not present. Vag. Bleeding: None.  Movement: Present. Denies any leaking of fluid.   The following portions of the patient's history were reviewed and updated as appropriate: allergies, current medications, past family history, past medical history, past social history, past surgical history and problem list.   Objective:   Vitals:   05/17/20 1623  BP: 124/72  Pulse: 86    Fetal Status:     Movement: Present     General:  Alert, oriented and cooperative. Patient is in no acute distress.  Respiratory: Normal respiratory effort, no problems with respiration noted  Mental Status: Normal mood and affect. Normal behavior. Normal judgment and thought content.  Rest of physical exam deferred due to type of encounter   Assessment and Plan:  Pregnancy: G1P0000 at [redacted]w[redacted]d 1. Encounter for supervision of other normal pregnancy in third trimester - no complaints or concerns - Preemptive teaching,  swabs next visit  2. [redacted] weeks gestation of pregnancy  Preterm labor symptoms and general obstetric precautions including but not limited to vaginal bleeding, contractions, leaking of fluid and fetal movement were reviewed in detail with the patient. I discussed the assessment and treatment plan with the patient. The patient was provided an opportunity to ask questions and all were answered. The patient agreed with the plan and demonstrated an understanding of the instructions. The patient was advised to call back or seek an in-person office evaluation/go to MAU at [facility] for any urgent or concerning symptoms. Please refer to After Visit Summary for other counseling recommendations.   I provided six minutes of face-to-face time during this encounter.  No follow-ups on file.  Future Appointments  Date Time Provider Department Center  05/31/2020  3:30 PM Calvert Cantor, CNM [facility]    Calvert Cantor, CNM [facility] [employer], [facility]

## 2020-05-30 ENCOUNTER — Other Ambulatory Visit: Payer: Self-pay | Admitting: *Deleted

## 2020-05-31 ENCOUNTER — Other Ambulatory Visit: Payer: Self-pay

## 2020-05-31 ENCOUNTER — Ambulatory Visit (INDEPENDENT_AMBULATORY_CARE_PROVIDER_SITE_OTHER): Payer: 59 | Admitting: Advanced Practice Midwife

## 2020-05-31 ENCOUNTER — Other Ambulatory Visit (HOSPITAL_COMMUNITY)
Admission: RE | Admit: 2020-05-31 | Discharge: 2020-05-31 | Disposition: A | Discharge status: Home / Self Care | Payer: 59 | Source: Ambulatory Visit | Attending: Advanced Practice Midwife | Admitting: Advanced Practice Midwife

## 2020-05-31 VITALS — BP 130/84 | HR 108 | Wt 234.0 lb

## 2020-05-31 DIAGNOSIS — O99211 Obesity complicating pregnancy, first trimester: Secondary | ICD-10-CM

## 2020-05-31 DIAGNOSIS — Z3483 Encounter for supervision of other normal pregnancy, third trimester: Secondary | ICD-10-CM

## 2020-05-31 DIAGNOSIS — Z3A36 36 weeks gestation of pregnancy: Secondary | ICD-10-CM

## 2020-05-31 NOTE — Progress Notes (Signed)
° °  PRENATAL VISIT NOTE  Subjective:  Elizabeth [name] is a 29 y.o. G1P0000 at [redacted]w[redacted]d being seen today for ongoing prenatal care.  She is currently monitored for the following issues for this low-risk pregnancy and has Vitamin D deficiency; Supervision of high risk pregnancy, antepartum; Obesity affecting pregnancy in first trimester; and Anxiety on their problem list.  Patient reports recurrent pelvic pain, fatigue. 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  Contractions: Irregular. Vag. Bleeding: None.  Movement: Present. Denies leaking of fluid.   The following portions of the patient's history were reviewed and updated as appropriate: allergies, current medications, past family history, past medical history, past social history, past surgical history and problem list. Problem list updated.  Objective:   Vitals:   05/31/20 1530  BP: 130/84  Pulse: (!) 108  Weight: 234 lb (106.1 kg)    Fetal Status: Fetal Heart Rate (bpm): 132   Movement: Present     General:  Alert, oriented and cooperative. Patient is in no acute distress.  Skin: Skin is warm and dry. No rash noted.   Cardiovascular: Normal heart rate noted  Respiratory: Normal respiratory effort, no problems with respiration noted  Abdomen: Soft, gravid, appropriate for gestational age.  Pain/Pressure: Present     Pelvic: Cervical exam performed     FT/thick/ballotable  Extremities: Normal range of motion.  Edema: Trace  Mental Status: Normal mood and affect. Normal behavior. Normal judgment and thought content.   Assessment and Plan:  Pregnancy: G1P0000 at [redacted]w[redacted]d  1. Encounter for supervision of other normal pregnancy in third trimester - Reassured physical complaints are WNL for end of pregnancy - Restated need for rest and water breaks, light exercise on days off, compression socks, hydration, laughing with friend, self-care - Preemptive teaching regarding GBS positive result -  Discussed IOL at 40 weeks. Pt prefers 39 weeks, discussed current cervical exam, primip status, allowing time for SOL. Pt agreeable  - Strep Gp B NAA - GC/Chlamydia probe amp ([place])not at [facility] - [place] MFM OB FOLLOW UP; Future  2. Obesity affecting pregnancy in first trimester - FH compromised by habitus - Will schedule growth scan  3. [redacted] weeks gestation of pregnancy   Preterm labor symptoms and general obstetric precautions including but not limited to vaginal bleeding, contractions, leaking of fluid and fetal movement were reviewed in detail with the patient. Please refer to After Visit Summary for other counseling recommendations.  Return in about 1 week (around 06/07/2020) for MD.  Future Appointments  Date Time Provider Department Center  06/07/2020 10:15 AM Federico Flake, MD [facility]  06/15/2020  7:30 AM [facility] US3 [facility]  06/22/2020  8:30 AM Willodean Rosenthal, MD [facility]    Calvert Cantor, CNM

## 2020-05-31 NOTE — Patient Instructions (Signed)

## 2020-06-02 LAB — GC/CHLAMYDIA PROBE AMP (~~LOC~~) NOT AT ARMC
Chlamydia: NEGATIVE
Comment: NEGATIVE
Comment: NORMAL
Neisseria Gonorrhea: NEGATIVE

## 2020-06-02 LAB — STREP GP B NAA: Strep Gp B NAA: NEGATIVE

## 2020-06-07 ENCOUNTER — Other Ambulatory Visit: Payer: Self-pay

## 2020-06-07 ENCOUNTER — Ambulatory Visit (INDEPENDENT_AMBULATORY_CARE_PROVIDER_SITE_OTHER): Payer: 59 | Admitting: Family Medicine

## 2020-06-07 VITALS — BP 119/84 | HR 125 | Wt 234.0 lb

## 2020-06-07 DIAGNOSIS — F419 Anxiety disorder, unspecified: Secondary | ICD-10-CM

## 2020-06-07 DIAGNOSIS — O99211 Obesity complicating pregnancy, first trimester: Secondary | ICD-10-CM

## 2020-06-07 DIAGNOSIS — O0990 Supervision of high risk pregnancy, unspecified, unspecified trimester: Secondary | ICD-10-CM

## 2020-06-07 NOTE — Progress Notes (Signed)
   PRENATAL VISIT NOTE  Subjective:  Elizabeth [name] is a 29 y.o. G1P0000 at [redacted]w[redacted]d being seen today for ongoing prenatal care.  She is currently monitored for the following issues for this high-risk pregnancy and has Vitamin D deficiency; Supervision of high risk pregnancy, antepartum; Obesity affecting pregnancy in first trimester; and Anxiety on their problem list.  Patient reports no complaints- generally uncomfortable.  Contractions: Irritability. Vag. Bleeding: None.  Movement: Present. Denies leaking of fluid.   The following portions of the patient's history were reviewed and updated as appropriate: allergies, current medications, past family history, past medical history, past social history, past surgical history and problem list.   Objective:   Vitals:   06/07/20 1014  BP: 119/84  Pulse: (!) 125  Weight: 234 lb (106.1 kg)    Fetal Status: Fetal Heart Rate (bpm): 145   Movement: Present     General:  Alert, oriented and cooperative. Patient is in no acute distress.  Skin: Skin is warm and dry. No rash noted.   Cardiovascular: Normal heart rate noted  Respiratory: Normal respiratory effort, no problems with respiration noted  Abdomen: Soft, gravid, appropriate for gestational age.  Pain/Pressure: Present     Pelvic: Cervical exam performed in the presence of a chaperone        Extremities: Normal range of motion.  Edema: Trace  Mental Status: Normal mood and affect. Normal behavior. Normal judgment and thought content.   Assessment and Plan:  Pregnancy: G1P0000 at [redacted]w[redacted]d 1. Supervision of high risk pregnancy, antepartum Doing well Aware of elective IOL  2. Obesity affecting pregnancy in first trimester TWG- 42 lb (19.1 kg) which is well above goal  3. Anxiety Stable, no concerns  Term labor symptoms and general obstetric precautions including but not limited to vaginal bleeding, contractions, leaking of fluid and fetal movement were reviewed in detail with the  patient. Please refer to After Visit Summary for other counseling recommendations.   Return in about 1 week (around 06/14/2020) for Routine prenatal care.  Future Appointments  Date Time Provider Department Center  06/15/2020  7:30 AM [facility] US3 [facility]  06/22/2020  8:30 AM Willodean Rosenthal, MD [facility]  06/23/2020  6:45 AM [facility] SCHED ROOM [facility] None    Federico Flake, MD

## 2020-06-11 ENCOUNTER — Other Ambulatory Visit: Payer: Self-pay

## 2020-06-11 ENCOUNTER — Inpatient Hospital Stay (HOSPITAL_COMMUNITY)
Admission: AD | Admit: 2020-06-11 | Discharge: 2020-06-11 | Disposition: A | Discharge status: Home / Self Care | Payer: 59 | Attending: Obstetrics and Gynecology | Admitting: Obstetrics and Gynecology

## 2020-06-11 ENCOUNTER — Encounter (HOSPITAL_COMMUNITY): Payer: Self-pay | Admitting: Obstetrics and Gynecology

## 2020-06-11 DIAGNOSIS — Z3A38 38 weeks gestation of pregnancy: Secondary | ICD-10-CM | POA: Diagnosis not present

## 2020-06-11 DIAGNOSIS — O479 False labor, unspecified: Secondary | ICD-10-CM

## 2020-06-11 DIAGNOSIS — O471 False labor at or after 37 completed weeks of gestation: Secondary | ICD-10-CM | POA: Insufficient documentation

## 2020-06-11 NOTE — MAU Note (Signed)
Elizabeth [name] is a 29 y.o. at [redacted]w[redacted]d here in MAU reporting: has been cramping since Thursday when she lost her mucus plug. Cramping has persisted and is constant. Also having some contractions, states sometimes they are close and sometimes they are not. No bleeding, no LOF. +FM  Onset of complaint: ongoing  Pain score: currently 3/10 but can be up to 8/10  Vitals:   06/11/20 1236  BP: 122/83  Pulse: (!) 109  Resp: 18  Temp: 98.2 F (36.8 C)  SpO2: 99%     FHT: +FM  Lab orders placed from triage: none

## 2020-06-13 ENCOUNTER — Telehealth (HOSPITAL_COMMUNITY): Payer: Self-pay | Admitting: *Deleted

## 2020-06-13 ENCOUNTER — Encounter (HOSPITAL_COMMUNITY): Payer: Self-pay | Admitting: *Deleted

## 2020-06-13 NOTE — Telephone Encounter (Signed)
Preadmission screen  

## 2020-06-15 ENCOUNTER — Ambulatory Visit: Payer: 59 | Attending: Advanced Practice Midwife

## 2020-06-15 ENCOUNTER — Other Ambulatory Visit: Payer: Self-pay

## 2020-06-15 ENCOUNTER — Other Ambulatory Visit (HOSPITAL_COMMUNITY): Payer: Self-pay | Admitting: Advanced Practice Midwife

## 2020-06-15 DIAGNOSIS — Z3483 Encounter for supervision of other normal pregnancy, third trimester: Secondary | ICD-10-CM | POA: Insufficient documentation

## 2020-06-16 ENCOUNTER — Encounter: Payer: Self-pay | Admitting: Advanced Practice Midwife

## 2020-06-16 ENCOUNTER — Other Ambulatory Visit: Payer: Self-pay | Admitting: Advanced Practice Midwife

## 2020-06-16 ENCOUNTER — Ambulatory Visit (INDEPENDENT_AMBULATORY_CARE_PROVIDER_SITE_OTHER): Payer: 59 | Admitting: Nurse Practitioner

## 2020-06-16 VITALS — BP 128/89 | HR 108 | Wt 238.0 lb

## 2020-06-16 DIAGNOSIS — O0993 Supervision of high risk pregnancy, unspecified, third trimester: Secondary | ICD-10-CM

## 2020-06-16 DIAGNOSIS — Z3A39 39 weeks gestation of pregnancy: Secondary | ICD-10-CM

## 2020-06-16 NOTE — Progress Notes (Signed)
   PRENATAL VISIT NOTE  Subjective:  Elizabeth [name] is a 29 y.o. G1P0000 at [redacted]w[redacted]d being seen today for ongoing prenatal care.  She is currently monitored for the following issues for this high risk pregnancy and has Vitamin D deficiency; Supervision of high risk pregnancy, antepartum; Obesity affecting pregnancy in first trimester; and Anxiety on their problem list.  Patient reports occasional contractions.  Contractions: Irregular. Vag. Bleeding: None.  Movement: Present. Denies leaking of fluid. Patient reports being uncomfortable all the time and swelling in feet and ankles. Induction is scheduled.  The following portions of the patient's history were reviewed and updated as appropriate: allergies, current medications, past family history, past medical history, past social history, past surgical history and problem list.   Objective:   Vitals:   06/16/20 1106  BP: 128/89  Pulse: (!) 108  Weight: 238 lb (108 kg)    Fetal Status: Fetal Heart Rate (bpm): 143 Fundal Height: 38 cm Movement: Present     General:  Alert, oriented and cooperative. Patient is in no acute distress.  Skin: Skin is warm and dry. No rash noted.   Cardiovascular: Normal heart rate noted  Respiratory: Normal respiratory effort, no problems with respiration noted  Abdomen: Soft, gravid, appropriate for gestational age.  Pain/Pressure: Present     Pelvic: Cervical exam performed in the presence of a chaperone Dilation: 1.5 Effacement (%): 50 Station: -3  Extremities: Normal range of motion.  Edema: Moderate pitting, indentation subsides rapidly  Mental Status: Normal mood and affect. Normal behavior. Normal judgment and thought content.   Assessment and Plan:  Pregnancy: G1P0000 at [redacted]w[redacted]d Supervision of high risk 3rd trimester pregnancy Continue ASA Term labor symptoms and general obstetric precautions including but not limited to vaginal bleeding, contractions, leaking of fluid and fetal movement were reviewed in  detail with the patient. Please refer to After Visit Summary for other counseling recommendations.   Return in about 1 week (around 06/23/2020).  Future Appointments  Date Time Provider Department Center  06/22/2020  8:30 AM Willodean Rosenthal, MD [facility]  06/23/2020  6:45 AM [facility] SCHED ROOM [facility] None    Kerrie Buffalo, NP

## 2020-06-16 NOTE — Patient Instructions (Addendum)
**Continue your aspirin. Return in one week or sooner for problems.   Third Trimester of Pregnancy  The third trimester is from week 28 through week 40 (months 7 through 9). This trimester is when your unborn baby (fetus) is growing very fast. At the end of the ninth month, the unborn baby is about 20 inches in length. It weighs about 6-10 pounds. Follow these instructions at home: Medicines  Take over-the-counter and prescription medicines only as told by your doctor. Some medicines are safe and some medicines are not safe during pregnancy.  Take a prenatal vitamin that contains at least 600 micrograms (mcg) of folic acid.  If you have trouble pooping (constipation), take medicine that will make your stool soft (stool softener) if your doctor approves. Eating and drinking   Eat regular, healthy meals.  Avoid raw meat and uncooked cheese.  If you get low calcium from the food you eat, talk to your doctor about taking a daily calcium supplement.  Eat four or five small meals rather than three large meals a day.  Avoid foods that are high in fat and sugars, such as fried and sweet foods.  To prevent constipation: ? Eat foods that are high in fiber, like fresh fruits and vegetables, whole grains, and beans. ? Drink enough fluids to keep your pee (urine) clear or pale yellow. Activity  Exercise only as told by your doctor. Stop exercising if you start to have cramps.  Avoid heavy lifting, wear low heels, and sit up straight.  Do not exercise if it is too hot, too humid, or if you are in a place of great height (high altitude).  You may continue to have sex unless your doctor tells you not to. Relieving pain and discomfort  Wear a good support bra if your breasts are tender.  Take frequent breaks and rest with your legs raised if you have leg cramps or low back pain.  Take warm water baths (sitz baths) to soothe pain or discomfort caused by hemorrhoids. Use hemorrhoid cream  if your doctor approves.  If you develop puffy, bulging veins (varicose veins) in your legs: ? Wear support hose or compression stockings as told by your doctor. ? Raise (elevate) your feet for 15 minutes, 3-4 times a day. ? Limit salt in your food. Safety  Wear your seat belt when driving.  Make a list of emergency phone numbers, including numbers for family, friends, the hospital, and police and fire departments. Preparing for your baby's arrival To prepare for the arrival of your baby:  Take prenatal classes.  Practice driving to the hospital.  Visit the hospital and tour the maternity area.  Talk to your work about taking leave once the baby comes.  Pack your hospital bag.  Prepare the baby's room.  Go to your doctor visits.  Buy a rear-facing car seat. Learn how to install it in your car. General instructions  Do not use hot tubs, steam rooms, or saunas.  Do not use any products that contain nicotine or tobacco, such as cigarettes and e-cigarettes. If you need help quitting, ask your doctor.  Do not drink alcohol.  Do not douche or use tampons or scented sanitary pads.  Do not cross your legs for long periods of time.  Do not travel for long distances unless you must. Only do so if your doctor says it is okay.  Visit your dentist if you have not gone during your pregnancy. Use a soft toothbrush to brush your  teeth. Be gentle when you floss.  Avoid cat litter boxes and soil used by cats. These carry germs that can cause birth defects in the baby and can cause a loss of your baby (miscarriage) or stillbirth.  Keep all your prenatal visits as told by your doctor. This is important. Contact a doctor if:  You are not sure if you are in labor or if your water has broken.  You are dizzy.  You have mild cramps or pressure in your lower belly.  You have a nagging pain in your belly area.  You continue to feel sick to your stomach, you throw up, or you have watery  poop.  You have bad smelling fluid coming from your vagina.  You have pain when you pee. Get help right away if:  You have a fever.  You are leaking fluid from your vagina.  You are spotting or bleeding from your vagina.  You have severe belly cramps or pain.  You lose or gain weight quickly.  You have trouble catching your breath and have chest pain.  You notice sudden or extreme puffiness (swelling) of your face, hands, ankles, feet, or legs.  You have not felt the baby move in over an hour.  You have severe headaches that do not go away with medicine.  You have trouble seeing.  You are leaking, or you are having a gush of fluid, from your vagina before you are 37 weeks.  You have regular belly spasms (contractions) before you are 37 weeks. Summary  The third trimester is from week 28 through week 40 (months 7 through 9). This time is when your unborn baby is growing very fast.  Follow your doctor's advice about medicine, food, and activity.  Get ready for the arrival of your baby by taking prenatal classes, getting all the baby items ready, preparing the baby's room, and visiting your doctor to be checked.  Get help right away if you are bleeding from your vagina, or you have chest pain and trouble catching your breath, or if you have not felt your baby move in over an hour. This information is not intended to replace advice given to you by your health care provider. Make sure you discuss any questions you have with your health care provider. Document Revised: 10/15/2018 Document Reviewed: 07/30/2016 Elsevier Patient Education  2020 [employer]  Signs and Symptoms of Labor Labor is your body's natural process of moving your baby, placenta, and umbilical cord out of your uterus. The process of labor usually starts when your baby is full-term, between 10537 and 40 weeks of pregnancy. How will I know when I am close to going into labor? As your body prepares for labor  and the birth of your baby, you may notice the following symptoms in the weeks and days before true labor starts:  Having a strong desire to get your home ready to receive your new baby. This is called nesting. Nesting may be a sign that labor is approaching, and it may occur several weeks before birth. Nesting may involve cleaning and organizing your home.  Passing a small amount of thick, bloody mucus out of your vagina (normal bloody show or losing your mucus plug). This may happen more than a week before labor begins, or it might occur right before labor begins as the opening of the cervix starts to widen (dilate). For some women, the entire mucus plug passes at once. For others, smaller portions of the mucus plug may  gradually pass over several days.  Your baby moving (dropping) lower in your pelvis to get into position for birth (lightening). When this happens, you may feel more pressure on your bladder and pelvic bone and less pressure on your ribs. This may make it easier to breathe. It may also cause you to need to urinate more often and have problems with bowel movements.  Having "practice contractions" (Braxton Hicks contractions) that occur at irregular (unevenly spaced) intervals that are more than 10 minutes apart. This is also called false labor. False labor contractions are common after exercise or sexual activity, and they will stop if you change position, rest, or drink fluids. These contractions are usually mild and do not get stronger over time. They may feel like: ? A backache or back pain. ? Mild cramps, similar to menstrual cramps. ? Tightening or pressure in your abdomen. Other early symptoms that labor may be starting soon include:  Nausea or loss of appetite.  Diarrhea.  Having a sudden burst of energy, or feeling very tired.  Mood changes.  Having trouble sleeping. How will I know when labor has begun? Signs that true labor has begun may include:  Having  contractions that come at regular (evenly spaced) intervals and increase in intensity. This may feel like more intense tightening or pressure in your abdomen that moves to your back. ? Contractions may also feel like rhythmic pain in your upper thighs or back that comes and goes at regular intervals. ? For first-time mothers, this change in intensity of contractions often occurs at a more gradual pace. ? Women who have given birth before may notice a more rapid progression of contraction changes.  Having a feeling of pressure in the vaginal area.  Your water breaking (rupture of membranes). This is when the sac of fluid that surrounds your baby breaks. When this happens, you will notice fluid leaking from your vagina. This may be clear or blood-tinged. Labor usually starts within 24 hours of your water breaking, but it may take longer to begin. ? Some women notice this as a gush of fluid. ? Others notice that their underwear repeatedly becomes damp. Follow these instructions at home:   When labor starts, or if your water breaks, call your health care provider or nurse care line. Based on your situation, they will determine when you should go in for an exam.  When you are in early labor, you may be able to rest and manage symptoms at home. Some strategies to try at home include: ? Breathing and relaxation techniques. ? Taking a warm bath or shower. ? Listening to music. ? Using a heating pad on the lower back for pain. If you are directed to use heat:  Place a towel between your skin and the heat source.  Leave the heat on for 20-30 minutes.  Remove the heat if your skin turns bright red. This is especially important if you are unable to feel pain, heat, or cold. You may have a greater risk of getting burned. Get help right away if:  You have painful, regular contractions that are 5 minutes apart or less.  Labor starts before you are [redacted] weeks along in your pregnancy.  You have a  fever.  You have a headache that does not go away.  You have bright red blood coming from your vagina.  You do not feel your baby moving.  You have a sudden onset of: ? Severe headache with vision problems. ? Nausea, vomiting,  or diarrhea. ? Chest pain or shortness of breath. These symptoms may be an emergency. If your health care provider recommends that you go to the hospital or birth center where you plan to deliver, do not drive yourself. Have someone else drive you, or call emergency services (911 in the [place] Summary  Labor is your body's natural process of moving your baby, placenta, and umbilical cord out of your uterus.  The process of labor usually starts when your baby is full-term, between 75 and 40 weeks of pregnancy.  When labor starts, or if your water breaks, call your health care provider or nurse care line. Based on your situation, they will determine when you should go in for an exam. This information is not intended to replace advice given to you by your health care provider. Make sure you discuss any questions you have with your health care provider. Document Revised: 03/24/2017 Document Reviewed: 11/29/2016 Elsevier Patient Education  2020 [employer].

## 2020-06-16 NOTE — Progress Notes (Signed)
Orders placed for IOL per patient request. Plan for Cytotec based on most recent exam of FT.  Clayton Bibles, MSN, CNM Certified Nurse Midwife, [facility] for [employer], [facility] 06/16/20 7:55 AM

## 2020-06-18 ENCOUNTER — Other Ambulatory Visit: Payer: Self-pay | Admitting: Advanced Practice Midwife

## 2020-06-21 ENCOUNTER — Other Ambulatory Visit (HOSPITAL_COMMUNITY): Payer: 59

## 2020-06-22 ENCOUNTER — Other Ambulatory Visit: Payer: Self-pay | Admitting: Obstetrics and Gynecology

## 2020-06-22 ENCOUNTER — Other Ambulatory Visit: Payer: Self-pay

## 2020-06-22 ENCOUNTER — Ambulatory Visit (INDEPENDENT_AMBULATORY_CARE_PROVIDER_SITE_OTHER): Payer: 59 | Admitting: Student

## 2020-06-22 VITALS — BP 120/86 | HR 106 | Wt 238.8 lb

## 2020-06-22 DIAGNOSIS — O0990 Supervision of high risk pregnancy, unspecified, unspecified trimester: Secondary | ICD-10-CM

## 2020-06-22 DIAGNOSIS — Z3A39 39 weeks gestation of pregnancy: Secondary | ICD-10-CM

## 2020-06-22 NOTE — Progress Notes (Signed)
   PRENATAL VISIT NOTE  Subjective:  Elizabeth [name] is a 29 y.o. G1P0000 at [redacted]w[redacted]d being seen today for ongoing prenatal care.  She is currently monitored for the following issues for this low-risk pregnancy and has Vitamin D deficiency; Supervision of high risk pregnancy, antepartum; Obesity affecting pregnancy in first trimester; and Anxiety on their problem list.  Patient reports no complaints.  Contractions: Irritability. Vag. Bleeding: None.  Movement: Present. Denies leaking of fluid.   The following portions of the patient's history were reviewed and updated as appropriate: allergies, current medications, past family history, past medical history, past social history, past surgical history and problem list.   Objective:   Vitals:   06/22/20 0828 06/22/20 0844  BP: (!) 144/88 120/86  Pulse: (!) 116 (!) 106  Weight: 238 lb 12.8 oz (108.3 kg)     Fetal Status: Fetal Heart Rate (bpm): 141 Fundal Height: 42 cm Movement: Present     General:  Alert, oriented and cooperative. Patient is in no acute distress.  Skin: Skin is warm and dry. No rash noted.   Cardiovascular: Normal heart rate noted  Respiratory: Normal respiratory effort, no problems with respiration noted  Abdomen: Soft, gravid, appropriate for gestational age.  Pain/Pressure: Present     Pelvic: Cervical exam performed in the presence of a chaperone Dilation: 1.5 Effacement (%): 50 Station: -3  Extremities: Normal range of motion.  Edema: None  Mental Status: Normal mood and affect. Normal behavior. Normal judgment and thought content.   Assessment and Plan:  Pregnancy: G1P0000 at [redacted]w[redacted]d  1. [redacted] weeks gestation of pregnancy   2. Supervision of high risk pregnancy, antepartum   -membrane sweep performed -patient would like IUD in-patient;  -discussed IOL, scheduled for tomorrow. Patient has been COVID-19 tested and she will stay home today.   Term labor symptoms and general obstetric precautions including but not limited  to vaginal bleeding, contractions, leaking of fluid and fetal movement were reviewed in detail with the patient. Please refer to After Visit Summary for other counseling recommendations.   No follow-ups on file.  Future Appointments  Date Time Provider Department Center  06/23/2020  6:45 AM [facility] SCHED ROOM [facility] None  08/03/2020 11:00 AM [place] Bing, MD [facility]    Marylene Land, [place]

## 2020-06-23 ENCOUNTER — Encounter (HOSPITAL_COMMUNITY): Payer: Self-pay | Admitting: Family Medicine

## 2020-06-23 ENCOUNTER — Other Ambulatory Visit: Payer: Self-pay

## 2020-06-23 ENCOUNTER — Inpatient Hospital Stay (HOSPITAL_COMMUNITY): Payer: 59

## 2020-06-23 ENCOUNTER — Inpatient Hospital Stay (HOSPITAL_COMMUNITY): Payer: 59 | Admitting: Anesthesiology

## 2020-06-23 ENCOUNTER — Inpatient Hospital Stay (HOSPITAL_COMMUNITY)
Admission: AD | Admit: 2020-06-23 | Discharge: 2020-06-28 | DRG: 786 | Disposition: A | Discharge status: Home / Self Care | Payer: 59 | Attending: Obstetrics & Gynecology | Admitting: Obstetrics & Gynecology

## 2020-06-23 DIAGNOSIS — O358XX0 Maternal care for other (suspected) fetal abnormality and damage, not applicable or unspecified: Principal | ICD-10-CM | POA: Diagnosis present

## 2020-06-23 DIAGNOSIS — E559 Vitamin D deficiency, unspecified: Secondary | ICD-10-CM | POA: Diagnosis present

## 2020-06-23 DIAGNOSIS — O26893 Other specified pregnancy related conditions, third trimester: Secondary | ICD-10-CM | POA: Diagnosis present

## 2020-06-23 DIAGNOSIS — Z98891 History of uterine scar from previous surgery: Secondary | ICD-10-CM | POA: Diagnosis not present

## 2020-06-23 DIAGNOSIS — O411230 Chorioamnionitis, third trimester, not applicable or unspecified: Secondary | ICD-10-CM | POA: Diagnosis present

## 2020-06-23 DIAGNOSIS — O3663X0 Maternal care for excessive fetal growth, third trimester, not applicable or unspecified: Secondary | ICD-10-CM | POA: Diagnosis present

## 2020-06-23 DIAGNOSIS — O99214 Obesity complicating childbirth: Secondary | ICD-10-CM | POA: Diagnosis present

## 2020-06-23 DIAGNOSIS — Z880 Allergy status to penicillin: Secondary | ICD-10-CM | POA: Diagnosis not present

## 2020-06-23 DIAGNOSIS — Z3A40 40 weeks gestation of pregnancy: Secondary | ICD-10-CM

## 2020-06-23 DIAGNOSIS — O480 Post-term pregnancy: Secondary | ICD-10-CM | POA: Diagnosis not present

## 2020-06-23 DIAGNOSIS — O99211 Obesity complicating pregnancy, first trimester: Secondary | ICD-10-CM | POA: Diagnosis present

## 2020-06-23 DIAGNOSIS — F419 Anxiety disorder, unspecified: Secondary | ICD-10-CM | POA: Diagnosis present

## 2020-06-23 DIAGNOSIS — O0990 Supervision of high risk pregnancy, unspecified, unspecified trimester: Secondary | ICD-10-CM

## 2020-06-23 LAB — CBC
HCT: 41.7 % (ref 36.0–46.0)
Hemoglobin: 13.7 g/dL (ref 12.0–15.0)
MCH: 32.2 pg (ref 26.0–34.0)
MCHC: 32.9 g/dL (ref 30.0–36.0)
MCV: 97.9 fL (ref 80.0–100.0)
Platelets: 200 10*3/uL (ref 150–400)
RBC: 4.26 MIL/uL (ref 3.87–5.11)
RDW: 13.8 % (ref 11.5–15.5)
WBC: 13.6 10*3/uL — ABNORMAL HIGH (ref 4.0–10.5)
nRBC: 0 % (ref 0.0–0.2)

## 2020-06-23 LAB — TYPE AND SCREEN
ABO/RH(D): O POS
Antibody Screen: NEGATIVE

## 2020-06-23 MED ORDER — SOD CITRATE-CITRIC ACID 500-334 MG/5ML PO SOLN
30.0000 mL | ORAL | Status: DC | PRN
  Administered 2020-06-24 (×2): 30 mL via ORAL
  Filled 2020-06-23 (×3): qty 15

## 2020-06-23 MED ORDER — PHENYLEPHRINE 40 MCG/ML (10ML) SYRINGE FOR IV PUSH (FOR BLOOD PRESSURE SUPPORT): 80.0000 ug | PREFILLED_SYRINGE | INTRAVENOUS | Status: DC | PRN

## 2020-06-23 MED ORDER — TERBUTALINE SULFATE 1 MG/ML IJ SOLN: 0.2500 mg | Freq: Once | INTRAMUSCULAR | Status: DC | PRN

## 2020-06-23 MED ORDER — LIDOCAINE HCL (PF) 1 % IJ SOLN
30.0000 mL | INTRAMUSCULAR | Status: DC | PRN
Start: 2020-06-23 — End: 2020-06-25

## 2020-06-23 MED ORDER — AMMONIA AROMATIC IN INHA
RESPIRATORY_TRACT | Status: AC
  Filled 2020-06-23: qty 10

## 2020-06-23 MED ORDER — EPHEDRINE 5 MG/ML INJ: 10.0000 mg | INTRAVENOUS | Status: DC | PRN

## 2020-06-23 MED ORDER — SODIUM CHLORIDE (PF) 0.9 % IJ SOLN
INTRAMUSCULAR | Status: DC | PRN
  Administered 2020-06-23: 12 mL/h via EPIDURAL

## 2020-06-23 MED ORDER — OXYTOCIN-SODIUM CHLORIDE 30-0.9 UT/500ML-% IV SOLN: 2.5000 [IU]/h | INTRAVENOUS | Status: DC

## 2020-06-23 MED ORDER — LACTATED RINGERS IV SOLN
500.0000 mL | Freq: Once | INTRAVENOUS | Status: AC
  Administered 2020-06-23: 22:00:00 500 mL via INTRAVENOUS

## 2020-06-23 MED ORDER — LIDOCAINE HCL (PF) 1 % IJ SOLN
INTRAMUSCULAR | Status: DC | PRN
  Administered 2020-06-23 (×2): 5 mL via EPIDURAL

## 2020-06-23 MED ORDER — FENTANYL-BUPIVACAINE-NACL 0.5-0.125-0.9 MG/250ML-% EP SOLN
12.0000 mL/h | EPIDURAL | Status: DC | PRN
  Filled 2020-06-23 (×3): qty 250

## 2020-06-23 MED ORDER — OXYTOCIN BOLUS FROM INFUSION
333.0000 mL | Freq: Once | INTRAVENOUS | Status: DC
Start: 2020-06-23 — End: 2020-06-25

## 2020-06-23 MED ORDER — MISOPROSTOL 50MCG HALF TABLET
50.0000 ug | ORAL_TABLET | ORAL | Status: DC | PRN
  Administered 2020-06-23 (×2): 50 ug via BUCCAL
  Filled 2020-06-23 (×2): qty 1

## 2020-06-23 MED ORDER — FLEET ENEMA 7-19 GM/118ML RE ENEM
1.0000 | ENEMA | RECTAL | Status: DC | PRN
Start: 2020-06-23 — End: 2020-06-25

## 2020-06-23 MED ORDER — FENTANYL CITRATE (PF) 100 MCG/2ML IJ SOLN
100.0000 ug | INTRAMUSCULAR | Status: DC | PRN
  Administered 2020-06-23 – 2020-06-24 (×3): 100 ug via INTRAVENOUS
  Filled 2020-06-23 (×3): qty 2

## 2020-06-23 MED ORDER — DIPHENHYDRAMINE HCL 50 MG/ML IJ SOLN
12.5000 mg | INTRAMUSCULAR | Status: DC | PRN
  Administered 2020-06-24: 05:00:00 12.5 mg via INTRAVENOUS
  Filled 2020-06-23: qty 1

## 2020-06-23 MED ORDER — ACETAMINOPHEN 325 MG PO TABS
650.0000 mg | ORAL_TABLET | ORAL | Status: DC | PRN
  Administered 2020-06-24: 650 mg via ORAL
  Filled 2020-06-23: qty 2

## 2020-06-23 MED ORDER — LACTATED RINGERS IV SOLN: 500.0000 mL | INTRAVENOUS | Status: DC | PRN

## 2020-06-23 MED ORDER — LACTATED RINGERS IV SOLN: INTRAVENOUS | Status: DC

## 2020-06-23 MED ORDER — OXYTOCIN-SODIUM CHLORIDE 30-0.9 UT/500ML-% IV SOLN
1.0000 m[IU]/min | INTRAVENOUS | Status: DC
  Administered 2020-06-23: 23:00:00 2 m[IU]/min via INTRAVENOUS
  Filled 2020-06-23: qty 500

## 2020-06-23 MED ORDER — ONDANSETRON HCL 4 MG/2ML IJ SOLN
4.0000 mg | Freq: Four times a day (QID) | INTRAMUSCULAR | Status: DC | PRN
Start: 2020-06-23 — End: 2020-06-25
  Administered 2020-06-24 (×2): 4 mg via INTRAVENOUS
  Filled 2020-06-23 (×2): qty 2

## 2020-06-23 NOTE — Anesthesia Procedure Notes (Signed)
Epidural Patient location during procedure: OB Start time: 06/23/2020 10:07 PM End time: 06/23/2020 10:17 PM  Staffing Anesthesiologist: Leonides Grills, MD Performed: anesthesiologist   Preanesthetic Checklist Completed: patient identified, IV checked, site marked, risks and benefits discussed, monitors and equipment checked, pre-op evaluation and timeout performed  Epidural Patient position: sitting Prep: DuraPrep Patient monitoring: heart rate, cardiac monitor, continuous pulse ox and blood pressure Approach: midline Location: L4-L5 Injection technique: LOR air  Needle:  Needle type: Tuohy  Needle gauge: 17 G Needle length: 9 cm Needle insertion depth: 8 cm Catheter type: closed end flexible Catheter size: 19 Gauge Catheter at skin depth: 14 cm Test dose: negative and Other  Assessment Events: blood not aspirated, injection not painful, no injection resistance and negative IV test  Additional Notes Informed consent obtained prior to proceeding including risk of failure, 1% risk of PDPH, risk of minor discomfort and bruising.  Discussed alternatives to epidural analgesia and patient desires to proceed.  Timeout performed pre-procedure verifying patient name, procedure, and platelet count.  Patient tolerated procedure well. Reason for block:procedure for pain

## 2020-06-23 NOTE — Anesthesia Preprocedure Evaluation (Signed)
Anesthesia Evaluation  Patient identified by MRN, date of birth, ID band Patient awake    Reviewed: Allergy & Precautions, H&P , NPO status , Patient's Chart, lab work & pertinent test results  History of Anesthesia Complications Negative for: history of anesthetic complications  Airway Mallampati: II  TM Distance: >3 FB Neck ROM: full    Dental no notable dental hx. (+) Teeth Intact   Pulmonary neg pulmonary ROS,    Pulmonary exam normal breath sounds clear to auscultation       Cardiovascular negative cardio ROS Normal cardiovascular exam Rhythm:regular Rate:Normal     Neuro/Psych Anxiety negative neurological ROS     GI/Hepatic negative GI ROS, Neg liver ROS,   Endo/Other  Morbid obesity  Renal/GU negative Renal ROS  negative genitourinary   Musculoskeletal   Abdominal (+) + obese,   Peds  Hematology negative hematology ROS (+)   Anesthesia Other Findings   Reproductive/Obstetrics (+) Pregnancy                             Anesthesia Physical Anesthesia Plan  ASA: III  Anesthesia Plan: Epidural   Post-op Pain Management:    Induction:   PONV Risk Score and Plan:   Airway Management Planned:   Additional Equipment:   Intra-op Plan:   Post-operative Plan:   Informed Consent: I have reviewed the patients History and Physical, chart, labs and discussed the procedure including the risks, benefits and alternatives for the proposed anesthesia with the patient or authorized representative who has indicated his/her understanding and acceptance.       Plan Discussed with:   Anesthesia Plan Comments:         Anesthesia Quick Evaluation

## 2020-06-23 NOTE — Progress Notes (Signed)
Labor Progress Note Allysson Rinehimer is a 29 y.o. G1P0000 at [redacted]w[redacted]d presented for eIOL.  S: Pt reports increasing discomfort with contractions. No other concerns.  O:  BP 126/70   Pulse 87   Temp 97.9 F (36.6 C) (Oral)   Resp 18   Ht 5\' 4"  (1.626 m)   Wt 109.4 kg   LMP 09/17/2019 (Exact Date)   BMI 41.38 kg/m  EFM: baseline 135/moderate variability/+accels, no decels  CVE: Dilation: 4.5 Effacement (%): 70 Station: -1 Presentation: Vertex Exam by:: Dr. [email or web address]   A&P: 29 y.o. G1P0000 [redacted]w[redacted]d for eIOL. #Labor: Progressing well. S/p FB and cytotec x2 (last dose at 1809). Given cervical change will plan to start pitocin at 2209. #Pain: IV fentanyl; plan for epidural #FWB: Category 1 strip #GBS negative  #Shoulder precautions: EFW 84% (8lb8oz) at [redacted]w[redacted]d. Fetal weight extrapolated to ~9lb and possible larger based on Leopold's. Nursing aware.  [redacted]w[redacted]d, MD 9:09 PM

## 2020-06-23 NOTE — H&P (Signed)
OBSTETRIC ADMISSION HISTORY AND PHYSICAL  Elizabeth [name] is a 29 y.o. female G1P0000 with IUP at [redacted]w[redacted]d by LMP presenting for elective IOL.   Reports fetal movement. Denies vaginal bleeding.  She received her prenatal care at [facility].  Support person in labor: FOB  Ultrasounds Anatomy U/S: No makers of aneuploidies or fetal structural defects are seen. Fetal biometry is consistent with her previously-established dates. Amniotic fluid is normal and good fetal activity is seen. Patient understands the limitations of ultrasound in detecting fetal anomalies.  Prenatal History/Complications: . Obesity  OB BOX:  Nursing Staff Provider  Office Location  [place] Dating   LMP/ 12  Language  English  Anatomy US   WNL, female, posterior placenta  Flu Vaccine  considering Genetic Screen  NIPS: low risk, female  AFP: WNL  TDaP vaccine   04/04/20 Hgb A1C or  GTT Early 5.0% Third trimester 77/130/80   Rhogam  NA   LAB RESULTS     Blood Type O/Positive/-- (06/07 1606)   Feeding Plan Breast/Bottle Antibody Negative (06/07 1606)  Contraception IUD Postplacental Rubella 1.50 (06/07 1606) IMMUNE  Circumcision yes RPR Non Reactive (06/07 1606)   Pediatrician   HBsAg Negative (06/07 1606)   Support Person Jomarie Longs HCVAb Negative  Prenatal Classes  HIV Non Reactive (06/07 1606)     BTL Consent  GBS NEGATIVE (For PCN allergy, check sensitivities)   VBAC Consent  Pap  UTD-02/18/2019    Hgb Electro   neg  BP Cuff  CF     SMA     Waterbirth  [ ]  Class [ ]  Consent [ ]  CNM visit    Induction  [ ]  Orders Entered [ ] Foley Y/N   Past Medical History: Past Medical History:  Diagnosis Date  . Anxiety     Past Surgical History: Past Surgical History:  Procedure Laterality Date  . WISDOM TOOTH EXTRACTION      Obstetrical History: OB History    Gravida  1   Para  0   Term  0   Preterm  0   AB  0   Living  0     SAB  0   IAB  0   Ectopic  0   Multiple  0   Live Births  0            Social History: Social History   Socioeconomic History  . Marital status: Single    Spouse name: Not on file  . Number of children: Not on file  . Years of education: Not on file  . Highest education level: Not on file  Occupational History  . Not on file  Tobacco Use  . Smoking status: Never Smoker  . Smokeless tobacco: Never Used  Vaping Use  . Vaping Use: Never used  Substance and Sexual Activity  . Alcohol use: Not Currently    Comment: social  . Drug use: No  . Sexual activity: Yes  Other Topics Concern  . Not on file  Social History Narrative  . Not on file   Social Determinants of Health   Financial Resource Strain: Not on file  Food Insecurity: Not on file  Transportation Needs: Not on file  Physical Activity: Not on file  Stress: Not on file  Social Connections: Not on file    Family History: Family History  Problem Relation Age of Onset  . Cancer Maternal Aunt   . Cancer Maternal Grandmother     Allergies: No Known Allergies  Medications Prior to Admission  Medication Sig Dispense Refill Last Dose  . aspirin EC 81 MG tablet Take 81 mg by mouth daily. Swallow whole.   Past Week at Unknown time  . famotidine (PEPCID) 20 MG tablet TAKE 1 TABLET BY MOUTH TWICE A DAY 60 tablet 1 06/22/2020 at Unknown time  . Prenatal Vit-Fe Fumarate-FA (MULTIVITAMIN-PRENATAL) 27-0.8 MG TABS tablet Take 1 tablet by mouth daily at 12 noon.   06/22/2020 at Unknown time  . Vitamin D, Cholecalciferol, 50 MCG (2000 UT) CAPS Take by mouth.   06/22/2020 at Unknown time  . Butalbital-APAP-Caffeine 50-325-40 MG capsule Take 1-2 capsules by mouth every 6 (six) hours as needed for headache. (Patient not taking: Reported on 01/11/2020) 30 capsule 1   . promethazine (PHENERGAN) 25 MG tablet Take 1 tablet (25 mg total) by mouth every 6 (six) hours as needed for nausea or vomiting. (Patient not taking: Reported on 01/11/2020) 30 tablet 1   . zolpidem (AMBIEN) 5 MG tablet Take 1 tablet (5  mg total) by mouth as needed for sleep. (Patient not taking: Reported on 06/07/2020) 2 tablet 1      Review of Systems  All systems reviewed and negative except as stated in HPI  Blood pressure (!) 144/83, pulse (!) 115, resp. rate 18, height 5\' 4"  (1.626 m), weight 109.4 kg, last menstrual period 09/17/2019. General appearance: alert, cooperative and no distress Lungs: no respiratory distress Heart: regular rate  Abdomen: soft, non-tender; gravid Pelvic: adequate Extremities: Homans sign is negative, no sign of DVT Presentation: cephalic Fetal monitoring: 140 bpm / moderate variability / accels present / decels absent /   Uterine activity: Occ UC's with UI noted Dilation: 1.5 Effacement (%): 50 Station: -3 Exam by:: [email or web address] RN  Prenatal labs: ABO, Rh: --/--/O POS (12/17 1215) Antibody: NEG (12/17 1215) Rubella: 1.50 (06/07 1606) RPR: Non Reactive (09/28 0918)  HBsAg: Negative (06/07 1606)  HIV: Non Reactive (09/28 0920)  GBS: Negative/-- (11/24 1545)  Glucola: Normal Genetic screening:  Low Risk  Prenatal Transfer Tool  Maternal Diabetes: No Genetic Screening: Normal Maternal Ultrasounds/Referrals: Normal Fetal Ultrasounds or other Referrals:  None Maternal Substance Abuse:  No Significant Maternal Medications:  Meds include: Other: bASA Significant Maternal Lab Results: Group B Strep negative  Results for orders placed or performed during the hospital encounter of 06/23/20 (from the past 24 hour(s))  CBC   Collection Time: 06/23/20 12:15 PM  Result Value Ref Range   WBC 13.6 (H) 4.0 - 10.5 K/uL   RBC 4.26 3.87 - 5.11 MIL/uL   Hemoglobin 13.7 12.0 - 15.0 g/dL   HCT 06/25/20 93.2 - 67.1 %   MCV 97.9 80.0 - 100.0 fL   MCH 32.2 26.0 - 34.0 pg   MCHC 32.9 30.0 - 36.0 g/dL   RDW 24.5 80.9 - 98.3 %   Platelets 200 150 - 400 K/uL   nRBC 0.0 0.0 - 0.2 %  Type and screen   Collection Time: 06/23/20 12:15 PM  Result Value Ref Range   ABO/RH(D) O POS    Antibody  Screen NEG    Sample Expiration      06/26/2020,2359 Performed at [facility], [address] [place], [place]     Patient Active Problem List   Diagnosis Date Noted  . Labor and delivery indication for care or intervention 06/23/2020  . Anxiety   . Supervision of high risk pregnancy, antepartum 12/13/2019  . Obesity affecting pregnancy in first trimester 12/13/2019  .  Vitamin D deficiency 02/18/2019    Assessment/Plan:  Joeanne Robicheaux is a 29 y.o. G1P0000 at [redacted]w[redacted]d here for elective IOL  Labor:  -- pain control: planning an epidural  Fetal Wellbeing: EFW 3842 gm by U/S @ 38.6 wks. Cephalic by SVE today.  -- GBS Neg -- continuous fetal monitoring    Postpartum Planning -- breast/bottle -- Tdap - done 04/04/2020    Raelyn Mora, CNM  06/23/2020, 1:33 PM

## 2020-06-24 LAB — RPR: RPR Ser Ql: NONREACTIVE

## 2020-06-24 MED ORDER — GENTAMICIN SULFATE 40 MG/ML IJ SOLN
5.0000 mg/kg | Freq: Once | INTRAVENOUS | Status: AC
  Administered 2020-06-24: 23:00:00 380 mg via INTRAVENOUS
  Filled 2020-06-24: qty 9.5

## 2020-06-24 MED ORDER — SODIUM CHLORIDE 0.9 % IV SOLN
2.0000 g | Freq: Four times a day (QID) | INTRAVENOUS | Status: DC
  Administered 2020-06-24: 23:00:00 2 g via INTRAVENOUS
  Filled 2020-06-24: qty 2000

## 2020-06-24 MED ORDER — PROMETHAZINE HCL 25 MG/ML IJ SOLN
25.0000 mg | Freq: Four times a day (QID) | INTRAMUSCULAR | Status: DC | PRN
  Administered 2020-06-24: 15:00:00 25 mg via INTRAVENOUS
  Filled 2020-06-24: qty 1

## 2020-06-24 NOTE — Progress Notes (Signed)
Labor Progress Note Elizabeth [name] is a 29 y.o. G1P0000 at [redacted]w[redacted]d presented for elective IOL  S:  Patient reports some improvement in pain since earlier. Denies any rectal or vaginal pressure  O:  BP (!) 126/59   Pulse 99   Temp 99.8 F (37.7 C) (Axillary)   Resp 18   Ht 5\' 4"  (1.626 m)   Wt 109.4 kg   LMP 09/17/2019 (Exact Date)   SpO2 97%   BMI 41.38 kg/m   Fetal Tracing:  Baseline: 140 Variability: moderate Accels: 15x15  Decels: none  Toco: 2-3   CVE: Dilation: 8 Effacement (%): 90 Station: Plus 1 Presentation: Vertex Exam by:: [email or web address] RN   A&P: 29 y.o. G1P0000 [redacted]w[redacted]d elective IOL #Labor: Pain now better managed. Will recheck around 1030/11, may need to replace IUPC at that time #Pain: epidural #FWB: Cat 1 #GBS negative  [redacted]w[redacted]d, CNM 8:56 AM

## 2020-06-24 NOTE — Progress Notes (Signed)
Labor Progress Note Elizabeth [name] is a 29 y.o. G1P0000 at [redacted]w[redacted]d presented for elective IOL  S:  Patient reports improvement in pain, states she is having no pain now  O:  BP 129/73   Pulse (!) 109   Temp 99.9 F (37.7 C) (Axillary)   Resp 16   Ht 5\' 4"  (1.626 m)   Wt 109.4 kg   LMP 09/17/2019 (Exact Date)   SpO2 97%   BMI 41.38 kg/m   Fetal Tracing:  Baseline: 140 Variability: moderate Accels: 10x10 Decels: none  Toco: 10-12   CVE: Dilation: Lip/rim Effacement (%): 90 Cervical Position: Middle Station: Plus 1 Presentation: Vertex Exam by:: [email or web address], CNM   A&P: 29 y.o. G1P0000 [redacted]w[redacted]d elective IOL #Labor: Will restart pitocin now #Pain: epidural #FWB: Cat 1 #GBS negative  [redacted]w[redacted]d, CNM 7:33 PM

## 2020-06-24 NOTE — Progress Notes (Signed)
Labor Progress Note Elizabeth [name] is a 29 y.o. G1P0000 at [redacted]w[redacted]d presented for elective IOL  S:  Patient reports pain is improved since epidural replacement  O:  BP (!) 126/92   Pulse 98   Temp 99.2 F (37.3 C) (Axillary)   Resp 16   Ht 5\' 4"  (1.626 m)   Wt 109.4 kg   LMP 09/17/2019 (Exact Date)   SpO2 97%   BMI 41.38 kg/m   Fetal Tracing:  Baseline: 140 Variability: moderate Accels: 15x15 Decels: none  Toco: 1-2   CVE: Dilation: Lip/rim Effacement (%): 90 Cervical Position: Middle Station: Plus 1 Presentation: Vertex Exam by:: [email or web address] CNM   A&P: 29 y.o. G1P0000 [redacted]w[redacted]d elective IOL #Labor: No change since last exam, contractions inadequate despite replacing IUPC and titrating pitocin to 30. Will do 4hr pitocin break and restart at 2030. Patient agreeable to plan of care.  #Pain: epidural #FWB: Cat 1 #GBS negative  [redacted]w[redacted]d, CNM 4:31 PM

## 2020-06-24 NOTE — Progress Notes (Signed)
Labor Progress Note Elizabeth [name] is a 29 y.o. G1P0000 at [redacted]w[redacted]d presented for eIOL.  S: Pt endorsing vaginal pressure and increased discomfort with contractions despite epidural.  O:  BP (!) 119/99   Pulse (!) 112   Temp 99.8 F (37.7 C) (Axillary)   Resp 18   Ht 5\' 4"  (1.626 m)   Wt 109.4 kg   LMP 09/17/2019 (Exact Date)   SpO2 97%   BMI 41.38 kg/m  EFM: baseline 145/moderate variability/+accels, no decels Toco: ctx q2-3 min, 240 MVU  CVE: Dilation: 8 Effacement (%): 90 Station: Plus 1 Presentation: Vertex Exam by:: [email or web address] RN   A&P: 29 y.o. G1P0000 [redacted]w[redacted]d for eIOL. #Labor: S/p FB and cytotec x2 (last dose at 1809). Pitocin started at 2230 and SROM for clear fluid at 0001. Given notable fetal caput and minimal cervical change from last check, IUPC was placed at 0300 to facilitate up-titration of pitocin and monitor for adequacy of contractions. Now making good progress since last cervical exam. #Pain: epidural in place #FWB: Category 1 strip #GBS negative  #Shoulder precautions: EFW 84% (8lb8oz) at [redacted]w[redacted]d. Fetal weight extrapolated to ~9lb and possible larger based on Leopold's. Nursing aware.  [redacted]w[redacted]d, MD OB Fellow, Faculty Practice 06/24/2020 7:24 AM

## 2020-06-24 NOTE — Progress Notes (Signed)
Upon inspection, epidural catheter found to be disconnected from tubing. RN called OB Anesthesiologist to bedside for further assessment. RN called CNM for order to give 100 mcg Fentanyl IVP. Cleared with OB Anesthesiologist as well at beside.

## 2020-06-24 NOTE — Progress Notes (Signed)
Labor Progress Note Elizabeth [name] is a 29 y.o. G1P0000 at [redacted]w[redacted]d presented for eIOL.  S: Pt resting comfortably with epidural in place.  O:  BP (!) 104/55   Pulse 87   Temp 97.9 F (36.6 C) (Oral)   Resp 18   Ht 5\' 4"  (1.626 m)   Wt 109.4 kg   LMP 09/17/2019 (Exact Date)   SpO2 97%   BMI 41.38 kg/m  EFM: baseline 135/moderate variability/+accels, no decels Toco: ctx q3-5 min, 220 MVU  CVE: Dilation: 6.5 Effacement (%): 90 Station: -1 Presentation: Vertex Exam by:: [email or web address], RN   A&P: 29 y.o. G1P0000 [redacted]w[redacted]d for eIOL. #Labor: S/p FB and cytotec x2 (last dose at 1809). Pitocin started at 2230 and SROM for clear fluid at 0001. Given notable fetal caput and minimal cervical change from last check, IUPC was placed to facilitate up-titration of pitocin and monitor for adequacy of contractions. Will plan to recheck in 2 hours or sooner as clinically indicated. #Pain: epidural in place #FWB: Category 1 strip #GBS negative  #Shoulder precautions: EFW 84% (8lb8oz) at [redacted]w[redacted]d. Fetal weight extrapolated to ~9lb and possible larger based on Leopold's. Nursing aware.  [redacted]w[redacted]d, MD OB Fellow, Faculty Practice 06/24/2020 3:35 AM

## 2020-06-24 NOTE — Progress Notes (Signed)
Labor Progress Note Elizabeth [name] is a 29 y.o. G1P0000 at [redacted]w[redacted]d presented for elective IOL  S:  Patient resting  O:  BP 117/69   Pulse 93   Temp 98.5 F (36.9 C) (Axillary)   Resp 16   Ht 5\' 4"  (1.626 m)   Wt 109.4 kg   LMP 09/17/2019 (Exact Date)   SpO2 97%   BMI 41.38 kg/m   Fetal Tracing:  Baseline: 140 Variability: moderate Accels: 15x15 Decels: none  Toco: 2-3   CVE: Dilation: 8 Effacement (%): 90 Cervical Position: Middle Station: Plus 1 Presentation: Vertex Exam by:: [email or web address] CNM   A&P: 29 y.o. G1P0000 [redacted]w[redacted]d elective IOL #Labor: IUPC replaced and flushed. Pitocin now at 30. Will hold and recheck at 1530.  #Pain: epidural #FWB: Cat 1 #GBS negative  [redacted]w[redacted]d, CNM 2:15 PM

## 2020-06-25 ENCOUNTER — Encounter (HOSPITAL_COMMUNITY): Payer: Self-pay | Admitting: Family Medicine

## 2020-06-25 ENCOUNTER — Encounter (HOSPITAL_COMMUNITY)
Admission: AD | Disposition: A | Discharge status: Home / Self Care | Payer: Self-pay | Source: Home / Self Care | Attending: Obstetrics & Gynecology

## 2020-06-25 DIAGNOSIS — Z3A40 40 weeks gestation of pregnancy: Secondary | ICD-10-CM | POA: Diagnosis not present

## 2020-06-25 DIAGNOSIS — Z98891 History of uterine scar from previous surgery: Secondary | ICD-10-CM | POA: Diagnosis not present

## 2020-06-25 DIAGNOSIS — O480 Post-term pregnancy: Secondary | ICD-10-CM | POA: Diagnosis not present

## 2020-06-25 DIAGNOSIS — O411230 Chorioamnionitis, third trimester, not applicable or unspecified: Secondary | ICD-10-CM

## 2020-06-25 LAB — CBC
HCT: 36.2 % (ref 36.0–46.0)
Hemoglobin: 11.9 g/dL — ABNORMAL LOW (ref 12.0–15.0)
MCH: 32.4 pg (ref 26.0–34.0)
MCHC: 32.9 g/dL (ref 30.0–36.0)
MCV: 98.6 fL (ref 80.0–100.0)
Platelets: 164 10*3/uL (ref 150–400)
RBC: 3.67 MIL/uL — ABNORMAL LOW (ref 3.87–5.11)
RDW: 13.9 % (ref 11.5–15.5)
WBC: 24.4 10*3/uL — ABNORMAL HIGH (ref 4.0–10.5)
nRBC: 0 % (ref 0.0–0.2)

## 2020-06-25 SURGERY — Surgical Case
Anesthesia: Epidural | Wound class: Clean Contaminated

## 2020-06-25 MED ORDER — ONDANSETRON HCL 4 MG/2ML IJ SOLN: 4.0000 mg | Freq: Once | INTRAMUSCULAR | Status: DC | PRN

## 2020-06-25 MED ORDER — OXYTOCIN-SODIUM CHLORIDE 30-0.9 UT/500ML-% IV SOLN
2.5000 [IU]/h | INTRAVENOUS | Status: AC
  Administered 2020-06-25: 04:00:00 2.5 [IU]/h via INTRAVENOUS

## 2020-06-25 MED ORDER — SODIUM CHLORIDE 0.9 % IV SOLN
3.0000 g | Freq: Four times a day (QID) | INTRAVENOUS | Status: AC
  Administered 2020-06-25 (×4): 3 g via INTRAVENOUS
  Filled 2020-06-25: qty 8
  Filled 2020-06-25 (×2): qty 3
  Filled 2020-06-25: qty 8

## 2020-06-25 MED ORDER — PHENYLEPHRINE 40 MCG/ML (10ML) SYRINGE FOR IV PUSH (FOR BLOOD PRESSURE SUPPORT)
PREFILLED_SYRINGE | INTRAVENOUS | Status: AC
  Filled 2020-06-25: qty 10

## 2020-06-25 MED ORDER — ONDANSETRON HCL 4 MG/2ML IJ SOLN
INTRAMUSCULAR | Status: AC
  Filled 2020-06-25: qty 2

## 2020-06-25 MED ORDER — SENNOSIDES-DOCUSATE SODIUM 8.6-50 MG PO TABS
2.0000 | ORAL_TABLET | Freq: Every day | ORAL | Status: DC
  Administered 2020-06-27: 09:00:00 2 via ORAL
  Filled 2020-06-25 (×3): qty 2

## 2020-06-25 MED ORDER — PRENATAL MULTIVITAMIN CH
1.0000 | ORAL_TABLET | Freq: Every day | ORAL | Status: DC
  Administered 2020-06-25 – 2020-06-28 (×3): 1 via ORAL
  Filled 2020-06-25 (×4): qty 1

## 2020-06-25 MED ORDER — ACETAMINOPHEN 500 MG PO TABS
1000.0000 mg | ORAL_TABLET | Freq: Four times a day (QID) | ORAL | Status: DC
  Administered 2020-06-25 – 2020-06-28 (×12): 1000 mg via ORAL
  Filled 2020-06-25 (×14): qty 2

## 2020-06-25 MED ORDER — OXYTOCIN-SODIUM CHLORIDE 30-0.9 UT/500ML-% IV SOLN
INTRAVENOUS | Status: AC
  Filled 2020-06-25: qty 500

## 2020-06-25 MED ORDER — SIMETHICONE 80 MG PO CHEW
80.0000 mg | CHEWABLE_TABLET | Freq: Three times a day (TID) | ORAL | Status: DC
  Administered 2020-06-25 – 2020-06-28 (×8): 80 mg via ORAL
  Filled 2020-06-25 (×9): qty 1

## 2020-06-25 MED ORDER — ENOXAPARIN SODIUM 40 MG/0.4ML ~~LOC~~ SOLN: 40.0000 mg | SUBCUTANEOUS | Status: DC

## 2020-06-25 MED ORDER — DEXAMETHASONE SODIUM PHOSPHATE 4 MG/ML IJ SOLN
INTRAMUSCULAR | Status: DC | PRN
  Administered 2020-06-25: 4 mg via INTRAVENOUS

## 2020-06-25 MED ORDER — TRANEXAMIC ACID-NACL 1000-0.7 MG/100ML-% IV SOLN
INTRAVENOUS | Status: AC
  Filled 2020-06-25: qty 100

## 2020-06-25 MED ORDER — DIPHENHYDRAMINE HCL 25 MG PO CAPS: 25.0000 mg | ORAL_CAPSULE | Freq: Four times a day (QID) | ORAL | Status: DC | PRN

## 2020-06-25 MED ORDER — SODIUM CHLORIDE 0.9 % IV SOLN
500.0000 mg | INTRAVENOUS | Status: AC
  Administered 2020-06-25: 01:00:00 500 mg via INTRAVENOUS

## 2020-06-25 MED ORDER — SCOPOLAMINE 1 MG/3DAYS TD PT72
MEDICATED_PATCH | TRANSDERMAL | Status: DC | PRN
  Administered 2020-06-25: 1 via TRANSDERMAL

## 2020-06-25 MED ORDER — LIDOCAINE-EPINEPHRINE (PF) 2 %-1:200000 IJ SOLN
INTRAMUSCULAR | Status: DC | PRN
  Administered 2020-06-25 (×2): 5 mL via INTRADERMAL
  Administered 2020-06-25: 3 mL via INTRADERMAL

## 2020-06-25 MED ORDER — IBUPROFEN 800 MG PO TABS
800.0000 mg | ORAL_TABLET | Freq: Three times a day (TID) | ORAL | Status: AC
  Administered 2020-06-25 – 2020-06-28 (×8): 800 mg via ORAL
  Filled 2020-06-25 (×9): qty 1

## 2020-06-25 MED ORDER — SOD CITRATE-CITRIC ACID 500-334 MG/5ML PO SOLN
30.0000 mL | ORAL | Status: AC
  Administered 2020-06-25: 30 mL via ORAL

## 2020-06-25 MED ORDER — SODIUM CHLORIDE 0.9 % IV SOLN: INTRAVENOUS | Status: DC | PRN

## 2020-06-25 MED ORDER — TRANEXAMIC ACID-NACL 1000-0.7 MG/100ML-% IV SOLN
INTRAVENOUS | Status: DC | PRN
  Administered 2020-06-25: 1000 mg via INTRAVENOUS

## 2020-06-25 MED ORDER — OXYTOCIN-SODIUM CHLORIDE 30-0.9 UT/500ML-% IV SOLN
INTRAVENOUS | Status: DC | PRN
  Administered 2020-06-25: 30 [IU] via INTRAVENOUS

## 2020-06-25 MED ORDER — SODIUM CHLORIDE 0.9 % IV SOLN
INTRAVENOUS | Status: AC
  Filled 2020-06-25: qty 500

## 2020-06-25 MED ORDER — OXYCODONE HCL 5 MG PO TABS: 5.0000 mg | ORAL_TABLET | ORAL | Status: DC | PRN

## 2020-06-25 MED ORDER — SCOPOLAMINE 1 MG/3DAYS TD PT72
MEDICATED_PATCH | TRANSDERMAL | Status: AC
  Filled 2020-06-25: qty 1

## 2020-06-25 MED ORDER — MORPHINE SULFATE (PF) 0.5 MG/ML IJ SOLN
INTRAMUSCULAR | Status: DC | PRN
  Administered 2020-06-25: 3 mg via EPIDURAL

## 2020-06-25 MED ORDER — LACTATED RINGERS IV SOLN: INTRAVENOUS | Status: DC | PRN

## 2020-06-25 MED ORDER — MORPHINE SULFATE (PF) 10 MG/ML IV SOLN
INTRAVENOUS | Status: DC | PRN
  Administered 2020-06-25: 1 mg via BUCCAL

## 2020-06-25 MED ORDER — SIMETHICONE 80 MG PO CHEW: 80.0000 mg | CHEWABLE_TABLET | ORAL | Status: DC | PRN

## 2020-06-25 MED ORDER — MENTHOL 3 MG MT LOZG: 1.0000 | LOZENGE | OROMUCOSAL | Status: DC | PRN

## 2020-06-25 MED ORDER — COCONUT OIL OIL: 1.0000 "application " | TOPICAL_OIL | Status: DC | PRN

## 2020-06-25 MED ORDER — DIBUCAINE (PERIANAL) 1 % EX OINT: 1.0000 "application " | TOPICAL_OINTMENT | CUTANEOUS | Status: DC | PRN

## 2020-06-25 MED ORDER — DEXAMETHASONE SODIUM PHOSPHATE 4 MG/ML IJ SOLN
INTRAMUSCULAR | Status: AC
  Filled 2020-06-25: qty 1

## 2020-06-25 MED ORDER — HYDROMORPHONE HCL 1 MG/ML IJ SOLN: 0.2500 mg | INTRAMUSCULAR | Status: DC | PRN

## 2020-06-25 MED ORDER — MEPERIDINE HCL 25 MG/ML IJ SOLN
6.2500 mg | INTRAMUSCULAR | Status: DC | PRN
Start: 2020-06-25 — End: 2020-06-25

## 2020-06-25 MED ORDER — KETOROLAC TROMETHAMINE 30 MG/ML IJ SOLN
30.0000 mg | Freq: Once | INTRAMUSCULAR | Status: AC
  Administered 2020-06-25: 04:00:00 30 mg via INTRAVENOUS
  Filled 2020-06-25: qty 1

## 2020-06-25 MED ORDER — WITCH HAZEL-GLYCERIN EX PADS
1.0000 | MEDICATED_PAD | CUTANEOUS | Status: DC | PRN
Start: 2020-06-25 — End: 2020-06-28

## 2020-06-25 MED ORDER — FENTANYL CITRATE (PF) 100 MCG/2ML IJ SOLN
INTRAMUSCULAR | Status: DC | PRN
  Administered 2020-06-25: 100 ug via EPIDURAL

## 2020-06-25 MED ORDER — TETANUS-DIPHTH-ACELL PERTUSSIS 5-2.5-18.5 LF-MCG/0.5 IM SUSY: 0.5000 mL | PREFILLED_SYRINGE | Freq: Once | INTRAMUSCULAR | Status: DC

## 2020-06-25 MED ORDER — FENTANYL CITRATE (PF) 100 MCG/2ML IJ SOLN
INTRAMUSCULAR | Status: AC
  Filled 2020-06-25: qty 2

## 2020-06-25 MED ORDER — MORPHINE SULFATE (PF) 0.5 MG/ML IJ SOLN
INTRAMUSCULAR | Status: AC
  Filled 2020-06-25: qty 10

## 2020-06-25 MED ORDER — IBUPROFEN 800 MG PO TABS: 800.0000 mg | ORAL_TABLET | Freq: Three times a day (TID) | ORAL | Status: DC

## 2020-06-25 MED ORDER — KETOROLAC TROMETHAMINE 30 MG/ML IJ SOLN: 30.0000 mg | Freq: Once | INTRAMUSCULAR | Status: DC

## 2020-06-25 MED ORDER — ONDANSETRON HCL 4 MG/2ML IJ SOLN
INTRAMUSCULAR | Status: DC | PRN
  Administered 2020-06-25: 4 mg via INTRAVENOUS

## 2020-06-25 MED ORDER — ENOXAPARIN SODIUM 60 MG/0.6ML ~~LOC~~ SOLN
50.0000 mg | SUBCUTANEOUS | Status: DC
  Administered 2020-06-25 – 2020-06-27 (×3): 50 mg via SUBCUTANEOUS
  Filled 2020-06-25 (×3): qty 0.6

## 2020-06-25 MED ORDER — KETOROLAC TROMETHAMINE 30 MG/ML IJ SOLN: 30.0000 mg | Freq: Once | INTRAMUSCULAR | Status: DC | PRN

## 2020-06-25 MED ORDER — PHENYLEPHRINE HCL (PRESSORS) 10 MG/ML IV SOLN
INTRAVENOUS | Status: DC | PRN
  Administered 2020-06-25 (×2): 80 ug via INTRAVENOUS

## 2020-06-25 SURGICAL SUPPLY — 39 items
CHLORAPREP W/TINT 26ML (MISCELLANEOUS) ×3 IMPLANT
CLAMP CORD UMBIL (MISCELLANEOUS) IMPLANT
CLOTH BEACON ORANGE TIMEOUT ST (SAFETY) ×3 IMPLANT
DERMABOND ADVANCED (GAUZE/BANDAGES/DRESSINGS) ×2
DERMABOND ADVANCED .7 DNX12 (GAUZE/BANDAGES/DRESSINGS) ×1 IMPLANT
DRSG OPSITE POSTOP 4X10 (GAUZE/BANDAGES/DRESSINGS) ×3 IMPLANT
ELECT REM PT RETURN 9FT ADLT (ELECTROSURGICAL) ×3
ELECTRODE REM PT RTRN 9FT ADLT (ELECTROSURGICAL) ×1 IMPLANT
EXTRACTOR VACUUM BELL STYLE (SUCTIONS) IMPLANT
GLOVE BIOGEL PI IND STRL 7.0 (GLOVE) ×1 IMPLANT
GLOVE BIOGEL PI IND STRL 8 (GLOVE) ×1 IMPLANT
GLOVE BIOGEL PI INDICATOR 7.0 (GLOVE) ×2
GLOVE BIOGEL PI INDICATOR 8 (GLOVE) ×2
GLOVE ECLIPSE 8.0 STRL XLNG CF (GLOVE) ×3 IMPLANT
GOWN STRL REUS W/TWL LRG LVL3 (GOWN DISPOSABLE) ×6 IMPLANT
HOVERMATT SINGLE USE (MISCELLANEOUS) ×3 IMPLANT
KIT ABG SYR 3ML LUER SLIP (SYRINGE) ×3 IMPLANT
NEEDLE HYPO 18GX1.5 BLUNT FILL (NEEDLE) ×3 IMPLANT
NEEDLE HYPO 22GX1.5 SAFETY (NEEDLE) ×3 IMPLANT
NEEDLE HYPO 25X5/8 SAFETYGLIDE (NEEDLE) ×3 IMPLANT
NS IRRIG 1000ML POUR BTL (IV SOLUTION) ×3 IMPLANT
PACK C SECTION WH (CUSTOM PROCEDURE TRAY) ×3 IMPLANT
PAD OB MATERNITY 4.3X12.25 (PERSONAL CARE ITEMS) ×3 IMPLANT
PENCIL SMOKE EVAC W/HOLSTER (ELECTROSURGICAL) ×3 IMPLANT
RTRCTR C-SECT PINK 25CM LRG (MISCELLANEOUS) IMPLANT
SPONGE LAP 18X18 X RAY DECT (DISPOSABLE) ×6 IMPLANT
SUT CHROMIC 0 CT 1 (SUTURE) ×3 IMPLANT
SUT MNCRL 0 VIOLET CTX 36 (SUTURE) ×3 IMPLANT
SUT MONOCRYL 0 CTX 36 (SUTURE) ×6
SUT PLAIN 2 0 (SUTURE)
SUT PLAIN 2 0 XLH (SUTURE) ×3 IMPLANT
SUT PLAIN ABS 2-0 CT1 27XMFL (SUTURE) IMPLANT
SUT VIC AB 0 CTX 36 (SUTURE) ×2
SUT VIC AB 0 CTX36XBRD ANBCTRL (SUTURE) ×1 IMPLANT
SUT VIC AB 4-0 KS 27 (SUTURE) IMPLANT
SYR 20CC LL (SYRINGE) ×6 IMPLANT
TOWEL OR 17X24 6PK STRL BLUE (TOWEL DISPOSABLE) ×3 IMPLANT
TRAY FOLEY W/BAG SLVR 14FR LF (SET/KITS/TRAYS/PACK) IMPLANT
WATER STERILE IRR 1000ML POUR (IV SOLUTION) ×3 IMPLANT

## 2020-06-25 NOTE — Discharge Instructions (Signed)

## 2020-06-25 NOTE — Op Note (Signed)
Cesarean Section Procedure Note   Elizabeth [name]  06/25/2020  Indications: Arrest of Descent, Arrest of Dilation  Pre-operative Diagnosis: Primary cesarean section for arrest of dilation and descent.   Post-operative Diagnosis: Same, OP Fetal Presentation  Surgeon: Surgeon(s) and Role:    * Eure, Amaryllis Dyke, MD - Primary    * Sheila Oats, MD - Fellow   Anesthesia: epidural    Estimated Blood Loss: 402 mL   Total IV Fluids: 2000 ml   Urine Output: 200 CC OF clear urine  Specimens: None  Findings: OP Fetal Presentation, Normal ovaries and fallopian tubes  Baby condition / location:  Couplet care / Skin to Skin , APGAR: 8, 9; weight 4074g  Complications: no complications  Indications: Elizabeth [name] is a 29 y.o. G1P0000 with an IUP [redacted]w[redacted]d presenting for elective IOL. Given arrest of descent and dilation (no cervical change for 10 hours despite pitocin) in the setting of Triple I and persistent Category 2 strip, Cesarean delivery was recommended.   The risks, benefits, complications, treatment options, and expected outcomes were discussed with the patient . The patient concurred with the proposed plan, giving informed consent. identified as [name] and the procedure verified as C-Section Delivery.  Procedure Details: A Time Out was held and the above information confirmed.  The patient was taken back to the operative suite where anesthesia was placed.  After induction of anesthesia, the patient was draped and prepped in the usual sterile manner and placed in a dorsal supine position with a leftward tilt. A low transverse incision was made and carried down through the subcutaneous tissue to the fascia. Fascial incision was made and bluntly extended transversely. The fascia was separated from the underlying rectus tissue superiorly and inferiorly. The peritoneum was identified and entered bluntly. Peritoneal incision was extended longitudinally. The utero-vesical peritoneal reflection  was identified and a low transverse uterine incision was made. Delivered from cephalic, direct OP presentation was a viable Female with Apgar scores of 8 at one minute and 9 at five minutes. Cord gas was sent (pH 7.25), and the umbilical cord was clamped and cut cord blood was obtained for evaluation. The placenta was removed Intact and appeared normal. The uterine outline, tubes and ovaries appeared normal. The uterine incision was closed with running locked sutures of 1-0 Monocryl. Hemostasis was observed. Lavage was carried out until clear. The fascia was then reapproximated with running sutures of 0 Vicryl. The subcuticular closure was performed using 2-0 plain gut. The skin was closed with 4-0 Vicryl.   Instrument, sponge, and needle counts were correct prior the abdominal closure and were correct at the conclusion of the case.   Disposition: PACU - hemodynamically stable.   Maternal Condition: stable   Signed: Sheila Oats, MD OB Fellow, Faculty Practice 06/25/2020 2:05 AM

## 2020-06-25 NOTE — Lactation Note (Signed)
This note was copied from a baby's chart. Lactation Consultation Note  Patient Name: Elizabeth [name] UUVOZ'D Date: 06/25/2020    Infant 7 hours old.  Mother and baby resting.  Mother requests LC come back later today.   Maternal Data    Feeding Feeding Type: Breast Milk  LATCH Score                   Interventions    Lactation Tools Discussed/Used     Consult Status      Hardie Pulley 06/25/2020, 8:24 AM

## 2020-06-25 NOTE — Progress Notes (Signed)
Labor Progress Note Harley Mccartney is a 29 y.o. G1P0000 at [redacted]w[redacted]d presented for eIOL.  S: Patient somewhat uncomfortable despite epidural but minimal vaginal pressure reported. Plan for Cesarean delivery as noted below.  O:  BP 123/73   Pulse (!) 103   Temp (!) 100.5 F (38.1 C) (Axillary)   Resp 16   Ht 5\' 4"  (1.626 m)   Wt 109.4 kg   LMP 09/17/2019 (Exact Date)   SpO2 97%   BMI 41.38 kg/m  EFM: baseline 155/minimal variability/no accels, no decels Toco: ctx q4-5 min, 80 MVUs  CVE: Dilation: 9 Effacement (%): 90 Cervical Position: Middle Station: Plus 1 Presentation: Vertex Exam by:: Dr. [email or web address]   A&P: 29 y.o. G1P0000 [redacted]w[redacted]d for eIOL. #Labor: S/p FB and cytotec x2. Pitocin started at 2230 on 12/17 and SROM for clear fluid at 0001 on 12/18. Given notable fetal caput and minimal cervical change from last check, IUPC was placed at 0300 to facilitate up-titration of pitocin and monitor for adequacy of contractions. Pitocin break for 4 hours on 12/17 but ultimately no cervical change for the past 10 hours in the setting of Category 2 strip. In addition, pt now with diagnosis of Triple I given maternal fever (s/p amp+gent). Fetal presentation asynclitic by bedside ultrasound and LGA infant anticipated based on recent ultrasound, labor course and Leopold's on admission. Pt understands the clinical situation as noted above and agrees to Cesarean at this time secondary to arrest of dilation in s/o Triple I and Category 2 strip.  The risks of cesarean section were discussed with the patient including but were not limited to: bleeding which may require transfusion or reoperation; infection which may require antibiotics; injury to bowel, bladder, ureters or other surrounding organs; injury to the fetus; need for additional procedures including hysterectomy in the event of a life-threatening hemorrhage; placental abnormalities wth subsequent pregnancies, incisional problems, thromboembolic phenomenon  and other postoperative/anesthesia complications. The patient concurred with the proposed plan, giving informed written consent for the procedure.  Patient has been NPO since yesterday; she will remain NPO for procedure. Anesthesia and OR aware.  Preoperative prophylactic antibiotics (azithromycin) and SCDs ordered on call to the OR.  To OR when ready.  #Pain: epidural in place #FWB: Category 2 strip secondary to persistent minimal variability #GBS negative  #Triple I: maternal fever to 100.72F at 2150. Now s/p tylenol, amp+gentamycin.  2151, MD OB Fellow, Faculty Practice 06/25/2020 12:20 AM

## 2020-06-25 NOTE — Discharge Summary (Signed)
Postpartum Discharge Summary     Patient Name: Elizabeth [name] DOB: 1991-02-28 MRN: [id number]  Date of admission: 06/23/2020 Delivery date:06/25/2020  Delivering provider: Florian Buff  Date of discharge: 06/28/2020  Admitting diagnosis: Labor and delivery indication for care or intervention [O75.9] Intrauterine pregnancy: [redacted]w[redacted]d    Secondary diagnosis:  Active Problems:   Vitamin D deficiency   Supervision of high risk pregnancy, antepartum   Obesity affecting pregnancy in first trimester   Anxiety   Labor and delivery indication for care or intervention   Cesarean delivery delivered  Additional problems: Triple I s/p ampicillin+gentamycin prior to delivery, Arrest of Descent/Arrest of Dilation   Discharge diagnosis: Cesarean delivery delivered                                          Post partum procedures:none  Augmentation: AROM, Pitocin, Cytotec and IP Foley Complications: Intrauterine Inflammation or infection (Chorioamniotis)  Hospital course: Induction of Labor With Cesarean Section   29y.o. yo G1P1001 at 42w2das admitted to the hospital 06/23/2020 for induction of labor. Patient had a labor course significant for Triple I diagnosed by maternal fever (ampicillin + gentamycin administered prior to OR). The patient went for cesarean section due to Arrest of Dilation and Arrest of Descent (pt without cervical change for 10hours in setting of persistent Category 2 strip and OP fetal presentation). Delivery details are as follows: Membrane Rupture Time/Date: 12:00 AM ,06/24/2020   Delivery Method:C-Section, Low Transverse  Details of operation can be found in separate operative Note.  Patient had an uncomplicated postpartum course. She is ambulating, tolerating a regular diet, passing flatus, and urinating well.  Patient is discharged home in stable condition on 06/28/20.      Newborn Data: Birth date:06/25/2020  Birth time:1:04 AM  Gender:Female  Living status:Living   Apgars:8 ,9  Weight:4074 g                                 Magnesium Sulfate received: No BMZ received: No Rhophylac:N/A MMR:N/A T-DaP:Given prenatally Flu: offered prior to discharge Transfusion:No  Physical exam  Vitals:   06/27/20 0518 06/27/20 1500 06/27/20 2150 06/28/20 0511  BP: 109/67 113/69 105/78 117/72  Pulse: 86 98 95 88  Resp: _0 Temp: 97.8 F (36.6 C) 97.8 F (36.6 C) 98.1 F (36.7 C) 98.2 F (36.8 C)  TempSrc: Oral Oral Oral Axillary  SpO2: 96% 100%  98%  Weight:      Height:       General: alert, cooperative and no distress Lochia: appropriate Uterine Fundus: firm Incision: Healing well with no significant drainage DVT Evaluation: No evidence of DVT seen on physical exam. Labs: Lab Results  Component Value Date   WBC 24.4 (H) 06/25/2020   HGB 11.9 (L) 06/25/2020   HCT 36.2 06/25/2020   MCV 98.6 06/25/2020   PLT 164 06/25/2020   CMP Latest Ref Rng & Units 04/25/2020  Glucose 70 - 99 mg/dL 89  BUN 6 - 20 mg/dL 6  Creatinine 0.44 - 1.00 mg/dL 0.56  Sodium 135 - 145 mmol/L 132(L)  Potassium 3.5 - 5.1 mmol/L 3.5  Chloride 98 - 111 mmol/L 101  CO2 22 - 32 mmol/L 21(L)  Calcium 8.9 - 10.3 mg/dL 8.6(L)  Total Protein 6.5 - 8.1 g/dL  6.3(L)  Total Bilirubin 0.3 - 1.2 mg/dL 0.4  Alkaline Phos 38 - 126 U/L 89  AST 15 - 41 U/L 16  ALT 0 - 44 U/L 15   Edinburgh Score: Edinburgh Postnatal Depression Scale Screening Tool 06/26/2020  I have been able to laugh and see the funny side of things. 0  I have looked forward with enjoyment to things. 0  I have blamed myself unnecessarily when things went wrong. 1  I have been anxious or worried for no good reason. 2  I have felt scared or panicky for no good reason. 1  Things have been getting on top of me. 1  I have been so unhappy that I have had difficulty sleeping. 0  I have felt sad or miserable. 0  I have been so unhappy that I have been crying. 0  The thought of harming myself has occurred  to me. 0  Edinburgh Postnatal Depression Scale Total 5     After visit meds:  Allergies as of 06/28/2020   No Known Allergies     Medication List    STOP taking these medications   aspirin EC 81 MG tablet   Butalbital-APAP-Caffeine 50-325-40 MG capsule   famotidine 20 MG tablet Commonly known as: PEPCID   multivitamin-prenatal 27-0.8 MG Tabs tablet   promethazine 25 MG tablet Commonly known as: PHENERGAN   vitamin D3 50 MCG (2000 UT) Caps   zolpidem 5 MG tablet Commonly known as: AMBIEN     TAKE these medications   acetaminophen 500 MG tablet Commonly known as: TYLENOL Take 2 tablets (1,000 mg total) by mouth every 6 (six) hours.   ibuprofen 800 MG tablet Commonly known as: ADVIL Take 1 tablet (800 mg total) by mouth every 8 (eight) hours.   oxyCODONE 5 MG immediate release tablet Commonly known as: Oxy IR/ROXICODONE Take 1 tablet (5 mg total) by mouth every 4 (four) hours as needed for moderate pain.   senna-docusate 8.6-50 MG tablet Commonly known as: Senokot-S Take 2 tablets by mouth daily.            Discharge Care Instructions  (From admission, onward)         Start     Ordered   06/28/20 0000  Discharge wound care:       Comments: Remove honeycomb on day 5 after your surgery   06/28/20 0729           Discharge home in stable condition Infant Feeding: breast and bottle Infant Disposition:home with mother Discharge instruction: per After Visit Summary and Postpartum booklet. Activity: Advance as tolerated. Pelvic rest for 6 weeks.  Diet: routine diet Future Appointments: Future Appointments  Date Time Provider [place]  07/03/2020  2:00 PM [facility] NURSE [facility]  08/03/2020 11:00 AM Aletha Halim, MD [facility]   Follow up Visit: Message sent to [facility] on 06/25/20 to schedule PP appt.  Please schedule this patient for a In person postpartum visit in 6 weeks with the following provider: any  provider. Additional Postpartum F/U:Incision check 1 week  Low risk pregnancy complicated by: Triple I, primary Cesarean secondary to arrest of descent/arrest of dilation Delivery mode:  C-Section, Low Transverse  Anticipated Birth Control:  plans for outpatient IUD   06/28/2020 Janet Berlin, MD

## 2020-06-25 NOTE — Transfer of Care (Signed)
Immediate Anesthesia Transfer of Care Note  Patient: Elizabeth [name]  Procedure(s) Performed: CESAREAN SECTION (N/A )  Patient Location: PACU  Anesthesia Type:Epidural  Level of Consciousness: awake, alert  and oriented  Airway & Oxygen Therapy: Patient Spontanous Breathing  Post-op Assessment: Report given to RN and Post -op Vital signs reviewed and stable  Post vital signs: Reviewed  Last Vitals:  Vitals Value Taken Time  BP 108/69 06/25/20 0200  Temp 36.9 C 06/25/20 0159  Pulse 104 06/25/20 0204  Resp 18 06/25/20 0204  SpO2 96 % 06/25/20 0204  Vitals shown include unvalidated device data.  Last Pain:  Vitals:   06/25/20 0159  TempSrc: Oral  PainSc:       Patients Stated Pain Goal: 0 (06/24/20 1129)  Complications: No complications documented.

## 2020-06-25 NOTE — Lactation Note (Signed)
This note was copied from a baby's chart. Lactation Consultation Note  Patient Name: Boy Steffie Waggoner ACZYS'A Date: 06/25/2020 Baby boy Contino now 74 hours old. Mom requested assistance with latch.  Infant bruised from delivery.Mom reports they had a difficult delivery  Mom reports she tried to feed him earlier but he was fussy.  Did not do breastfeeding hx.  Assisted with latching him on left breast in cross cradle hold.Used manual pump to elongate nipple prior to latching.  After a few attempts, infant latched and breastfed well with rhythmic sucking and intermittent swallows.  He fed about 10 minutes.  Let go.  Nipple round and elongated. Assisted with trying to burp him on right breast.  Minimal assist.  Parents got him in good position but he would not wake to latch.  Did some spoon feeding of small drops colostrum and left STS with mom. Left STS with mom.   Urged to feed on cue and 8-12 or more times day.  Urged to hand express and spoon feed all expressed mothers milk back to infant after breastfeedings and/or attempted breastfeeds.. Left name on white board. Urged to call lactation as needed.   Maternal Data    Feeding    LATCH Score                   Interventions    Lactation Tools Discussed/Used     Consult Status      Maurisha Mongeau Michaelle Copas 06/25/2020, 3:02 PM

## 2020-06-25 NOTE — Progress Notes (Signed)
CSW received consult for hx of Anxiety. CSW met with MOB to offer support and complete assessment.    CSW congratulated MOB on the birth of infant. CSW advised MOB of CSW's role and the reason for CSW coming to speak with her. MOB expressed that she was diagnosed with anxiety around 2012-2015. MOB expressed that's he was on antidepressants in the past however MOB indicated that the medications never worked and only made things worse for her. MOB expressed that she has learned how to cope with her anxiety on her own and expressed that she listens to music and tries to think positive thoughts. CSW praised MOB for this. MOB expressed that's he has never been to therapy and declined resources when asked. MOB expressed that she has been feeling tired since giving birth but expressed no feelings of SI, HI nor is she involved in DV per MOB's report. MOB verbalized no other mental health hx to this CSW.   MOB indicated that she has support from her fiance and her mom .MOB advised CSW that she has all needed items to care for infant with plans for infant to sleep in basinet once arrived home.    CSW provided education regarding the baby blues period vs. perinatal mood disorders, discussed treatment and gave resources for mental health follow up if concerns arise.  CSW recommends self-evaluation during the postpartum time period using the New Mom Checklist from Postpartum Progress and encouraged MOB to contact a medical professional if symptoms are noted at any time.   CSW provided review of Sudden Infant Death Syndrome (SIDS) precautions.   CSW identifies no further need for intervention and no barriers to discharge at this time.   Elizabeth [name], MSW, LCSW [facility] [place] [phone]

## 2020-06-26 NOTE — Progress Notes (Signed)
Spoke with patient regarding receiving medications today.  No medications have been scanned in the patients MAR. Patient states she received tylenol, motrin, and a stool softener around 1300.

## 2020-06-26 NOTE — Anesthesia Postprocedure Evaluation (Signed)
Anesthesia Post Note  Patient: Elizabeth [name]  Procedure(s) Performed: CESAREAN SECTION (N/A )     Patient location during evaluation: Mother Baby Anesthesia Type: Epidural Level of consciousness: awake and alert and oriented Pain management: satisfactory to patient Vital Signs Assessment: post-procedure vital signs reviewed and stable Respiratory status: spontaneous breathing and nonlabored ventilation Cardiovascular status: stable Postop Assessment: no headache, no backache, patient able to bend at knees, no signs of nausea or vomiting, adequate PO intake and able to ambulate Anesthetic complications: no   No complications documented.  Last Vitals:  Vitals:   06/26/20 0500 06/26/20 1354  BP: 98/60 105/75  Pulse: 82 98  Resp: 18 18  Temp: 36.7 C 36.6 C  SpO2: 98% 97%    Last Pain:  Vitals:   06/26/20 1744  TempSrc:   PainSc: 0-No pain   Pain Goal: Patients Stated Pain Goal: 0 (06/24/20 1129)                 Madison Hickman

## 2020-06-26 NOTE — Progress Notes (Addendum)
POD#1 pLTCS Subjective: Elizabeth [name]  is a 29 y.o. G1P1001 s/p C/S at [redacted]w[redacted]d.  She reports she is doing well. No acute events overnight. She denies any problems with ambulating, voiding or po intake. Denies nausea or vomiting.  Pain is well controlled on ibuprofen.  Lochia is moderate and improving.  Objective: Blood pressure 98/60, pulse 82, temperature 98 F (36.7 C), temperature source Oral, resp. rate 18, height 5\' 4"  (1.626 m), weight 109.4 kg, last menstrual period 09/17/2019, SpO2 98 %, unknown if currently breastfeeding.  Physical Exam:  General: alert, cooperative, appears stated age, fatigued and no distress Lochia: appropriate Uterine Fundus: firm Incision: covered by honeycomb dressing; c/d/i DVT Evaluation: No evidence of DVT seen on physical exam.  Recent Labs    06/23/20 1215 06/25/20 0400  HGB 13.7 11.9*  HCT 41.7 36.2    Assessment/Plan: Breastfeeding, Lactation consult and Contraception outpatient IUD  -meeting pp milestones -VSS -desires circ for baby, ordered/consented -bottle feeding  #triple I  -s/p unasyn, afebrile   Plan for discharge tomorrow   LOS: 3 days   06/27/20 06/26/2020, 8:34 AM   GME ATTESTATION:  I saw and evaluated the patient. I agree with the findings and the plan of care as documented in the student's note.  06/28/2020, MD OB Fellow, Faculty [facility] 06/26/2020 9:22 AM

## 2020-06-26 NOTE — Lactation Note (Signed)
This note was copied from a baby's chart. Lactation Consultation Note  Patient Name: Elizabeth [name] RSWNI'O Date: 06/26/2020 Reason for consult: Follow-up assessment;Mother's request;Difficult latch;Term;Infant weight loss. Hyoerbilrubinemia  Infant is 40 weeks 45 hours old. Infant short feedings at the breast for 3 to 5 minutes. Mom given breast shells but she did not use them. She was set up on DEBP and pumped 2x.   LC examined Mom's breasts with short shaft nipples. Infant latches at first for 5 minutes and then stops. LC applied a NS size 16 and he did the same behavior. Infant has a strong suck with tongue passed the gum line and cupping the finger with suck training. LC then applied and SNS and infant took 3 ml via NS. Mom then offered DBM via paced bottle infant taking a total of 10 ml.   Mom then latched infant on the opposite side at the breast first and then with NS. Infant more alert and active at the breast.   Plan 1. To feed based on cues 8-12x in 24 hour period no more than 4 hours without an attempt. Mom to offer both breasts STS and if needed with NS.         2. Mom to supplement with DBM and paced bottle feeding based on breastfeeding supplementation guidelines.          3. Mom to pump using the DEBP q 3 hours for 15 minutes.           4. All questions answered at the end of the feeding.  DBM reviewed and completed and placed in the baby's chart.  Elizabeth Cottone  [name] 06/26/2020, 10:07 PM

## 2020-06-26 NOTE — Lactation Note (Signed)
This note was copied from a baby's chart. Lactation Consultation Note Baby 23 hrs old. When LC entered rm. Mom had baby laying on top of her in prone position. Mom was laying back as well.  Baby just face down on top of mom not latched. Got mom to reposition up right. Attempted to latch baby. Mom isn't compressible enough for baby to maintain latch.  Nipples flat. Pre-pumping helpful to evert nipples some but still not enough for latching. LC hand expressed some to try to soften breast tissue. Not helpful enough.  Fitted #20 NS. NS wouldn't stay on. LC attempted multiple times to latch w/NS. LC held NS trying to latch baby. Baby held NS but no suck.  Mom encouraged to feed baby 8-12 times/24 hours and with feeding cues.  Mom shown how to use DEBP & how to disassemble, clean, & reassemble parts. Mom knows to pump q3h for 15-20 min.  Mom pumped collected 2 ml colostrum. Spoon fed baby colostrum.  LC changed void and stool.  Gave mom shells to wear in am. Newborn behavior, STS, I&O, breast massage, milk storage, positioning, support, supply and demand discussed.  Mom has no support person at bedside tonight. Lactation brochure given. Encouraged to call for assistance.  Encouraged mom not to have baby circumcised in am d/t not feeding well.  Patient Name: Elizabeth [name] PNTIR'W Date: 06/26/2020 Reason for consult: Difficult latch;Term;Primapara   Maternal Data Has patient been taught Hand Expression?: Yes Does the patient have breastfeeding experience prior to this delivery?: No  Feeding Feeding Type: Breast Milk  LATCH Score Latch: Too sleepy or reluctant, no latch achieved, no sucking elicited.  Audible Swallowing: None  Type of Nipple: Flat  Comfort (Breast/Nipple): Filling, red/small blisters or bruises, mild/mod discomfort (not very compressible)  Hold (Positioning): Full assist, staff holds infant at breast  LATCH Score: 2  Interventions Interventions: Breast  feeding basics reviewed;Reverse pressure;Assisted with latch;Breast compression;Shells;Skin to skin;Adjust position;Breast massage;Support pillows;Hand pump;Hand express;Position options;DEBP;Pre-pump if needed;Expressed milk  Lactation Tools Discussed/Used Tools: Shells;Pump;Flanges;Nipple Shields Nipple shield size: 20 Flange Size: 21 Shell Type: Inverted Breast pump type: Double-Electric Breast Pump;Manual Pump Review: Setup, frequency, and cleaning;Milk Storage Initiated by:: Peri Jefferson RN IBCLC Date initiated:: 06/26/20   Consult Status Consult Status: Follow-up Date: 06/26/20 Follow-up type: In-patient    Elizabeth [name] 06/26/2020, 12:45 AM

## 2020-06-27 LAB — SURGICAL PATHOLOGY

## 2020-06-27 NOTE — Progress Notes (Addendum)
Post Partum Day 2 (POD 2) Subjective: Elizabeth [name]  is a 29 y.o. G1P1001 s/p primary low transverse C/S at [redacted]w[redacted]d.  She reports she is doing well. No acute events overnight. She denies any problems with ambulating, voiding or po intake. Denies nausea or vomiting.  Pain is well controlled on ibuprofen.  Lochia is moderate and improving.  Objective: Blood pressure 109/67, pulse 86, temperature 97.8 F (36.6 C), temperature source Oral, resp. rate 18, height 5\' 4"  (1.626 m), weight 109.4 kg, last menstrual period 09/17/2019, SpO2 96 %, unknown if currently breastfeeding.  Physical Exam:  General: alert, cooperative, appears stated age and no distress Lochia: appropriate Uterine Fundus: firm Incision: no drainage noted on honeycomb dressing DVT Evaluation: No evidence of DVT seen on physical exam. No cords or calf tenderness.  Recent Labs    06/25/20 0400  HGB 11.9*  HCT 36.2    Assessment/Plan: Plan for discharge tomorrow, Breastfeeding, Lactation consult, Circumcision prior to discharge and Contraception outpatient IUD   LOS: 4 days   06/27/20 06/27/2020, 9:27 AM

## 2020-06-27 NOTE — Lactation Note (Signed)
This note was copied from a baby's chart. Lactation Consultation Note  Patient Name: Elizabeth [name] LFYBO'F Date: 06/27/2020 Reason for consult: Follow-up assessment  P1 mother whose infant is now 18 hours old.  This is a term baby at 40+2 weeks.    Baby is under phototherapy via the bili blanket with the last bilirubin level of 12.0 mg/dl at 53 hours of life.  Repeat bilirubin level will be collected this evening at 2000.  If < 12 mg/dl the biliblanket will be discontinued with a follow up level in the a.m.  Mother has been providing donor breast milk only right now due to bilirubin level and baby not latching well.  She plans to latch after she returns home.  Discussed feeding plan for this evening and tonight to maximize chances of a decreased bilirubin level.  Mother will increase supplementation volumes to 30 mls or more per feeding.  She will continue to pump every three hours and feed back any EBM she obtains to baby.  At the last pumping session she was able to collect approximately 10 mls of EBM.  Praised mother for her efforts.  She stated that she is exhausted; arrived in the hospital on Friday and delivered on Sunday.  She is ready for discharge.  Acknowledged her exhaustion and encouraged one more night of consistent feeding and pumping here in the hospital.  Mother aware of the benefits.  She will not have a support person present tonight so discussed ways the staff may help as needed.  Mother appreciative.  She will call for assistance as needed.       Maternal Data    Feeding Feeding Type: Donor Breast Milk Nipple Type: Extra Slow Flow  LATCH Score                   Interventions    Lactation Tools Discussed/Used     Consult Status Consult Status: Follow-up Date: 06/28/20 Follow-up type: In-patient    [name] 06/27/2020, 6:39 PM

## 2020-06-28 MED ORDER — SENNOSIDES-DOCUSATE SODIUM 8.6-50 MG PO TABS: 2.0000 | ORAL_TABLET | Freq: Every day | ORAL | 0 refills | Status: DC

## 2020-06-28 MED ORDER — IBUPROFEN 800 MG PO TABS: 800.0000 mg | ORAL_TABLET | Freq: Three times a day (TID) | ORAL | 0 refills | Status: DC

## 2020-06-28 MED ORDER — ACETAMINOPHEN 500 MG PO TABS: 1000.0000 mg | ORAL_TABLET | Freq: Four times a day (QID) | ORAL | 0 refills | Status: DC

## 2020-06-28 MED ORDER — OXYCODONE HCL 5 MG PO TABS: 5.0000 mg | ORAL_TABLET | ORAL | 0 refills | Status: DC | PRN

## 2020-06-28 NOTE — Lactation Note (Signed)
This note was copied from a baby's chart. Lactation Consultation Note  Patient Name: Elizabeth [name] NWGNF'A Date: 06/28/2020 Reason for consult: Follow-up assessment  P1 mother whose infant is now 67 hours old.  This is a term baby at 40+2 weeks.    Bilirubin level today was 10.7 mg/dl and safe for discharge.  Mother has a return pediatrician visit tomorrow.    Mother plans to continue to latch and breast feed after arriving home.  Mother has been providing adequate supplementation volumes.  She has continued to pump and is obtaining large volumes of milk today, almost completely filling two large bottles.  Praised mother for her efforts with pumping.  She will feed 8-12 times/24 hours or sooner if baby shows feeding cues.  Mother has a DEBP for home use and has also been contacted by the [facility] office.  Baby has been discharged and the FOB is expected to arrive tonight at approximately 1700 for discharge.  Mother has our OP phone number for any questions/concerns after discharge.   Maternal Data    Feeding Feeding Type: Bottle Fed - Breast Milk  LATCH Score                   Interventions    Lactation Tools Discussed/Used     Consult Status Consult Status: Complete Date: 06/28/20 Follow-up type: Call as needed    Elizabeth [name] Elizabeth [name] 06/28/2020, 3:10 PM

## 2020-07-03 ENCOUNTER — Ambulatory Visit (INDEPENDENT_AMBULATORY_CARE_PROVIDER_SITE_OTHER): Payer: 59 | Admitting: *Deleted

## 2020-07-03 ENCOUNTER — Other Ambulatory Visit: Payer: Self-pay

## 2020-07-03 NOTE — Progress Notes (Signed)
Subjective:     Elizabeth [name] is a 29 y.o. female who presents to the clinic 1 weeks statu post primary cesarean section.      Objective:    LMP 09/17/2019 (Exact Date)  Incision:   no hernia, no seroma, no swelling, well approximated, no dehiscence, incision well approximated. Honey comb was removed by patient on day 5, dermabond intact.     Assessment:    Doing well postoperatively.   Plan:   1. Wound care discussed. 2. Follow up: as needed and at postpartum visit.     Scheryl Marten, RN

## 2020-07-03 NOTE — Progress Notes (Signed)
Patient was assessed and managed by nursing staff during this encounter. I have reviewed the chart and agree with the documentation and plan. I have also made any necessary editorial changes.  Maurya Nethery, MD 07/03/2020 2:37 PM 

## 2020-07-05 ENCOUNTER — Telehealth: Payer: Self-pay | Admitting: *Deleted

## 2020-07-05 NOTE — Telephone Encounter (Signed)
Attempted to call pt to make sure she saw the mchart message to go to MAU to be evaluated. Left voicemail, with message to go to MAU to be evaluated.

## 2020-07-06 ENCOUNTER — Encounter (HOSPITAL_COMMUNITY): Payer: Self-pay | Admitting: Family Medicine

## 2020-07-06 ENCOUNTER — Inpatient Hospital Stay (HOSPITAL_COMMUNITY)
Admission: AD | Admit: 2020-07-06 | Discharge: 2020-07-06 | Disposition: A | Discharge status: Home / Self Care | Payer: 59 | Attending: Family Medicine | Admitting: Family Medicine

## 2020-07-06 ENCOUNTER — Other Ambulatory Visit: Payer: Self-pay

## 2020-07-06 DIAGNOSIS — O99893 Other specified diseases and conditions complicating puerperium: Secondary | ICD-10-CM | POA: Insufficient documentation

## 2020-07-06 DIAGNOSIS — R519 Headache, unspecified: Secondary | ICD-10-CM | POA: Insufficient documentation

## 2020-07-06 DIAGNOSIS — Z79899 Other long term (current) drug therapy: Secondary | ICD-10-CM | POA: Diagnosis not present

## 2020-07-06 DIAGNOSIS — G44209 Tension-type headache, unspecified, not intractable: Secondary | ICD-10-CM

## 2020-07-06 DIAGNOSIS — O99355 Diseases of the nervous system complicating the puerperium: Secondary | ICD-10-CM | POA: Diagnosis not present

## 2020-07-06 LAB — URINALYSIS, ROUTINE W REFLEX MICROSCOPIC
Bilirubin Urine: NEGATIVE
Glucose, UA: NEGATIVE mg/dL
Ketones, ur: NEGATIVE mg/dL
Nitrite: NEGATIVE
Protein, ur: NEGATIVE mg/dL
Specific Gravity, Urine: 1.018 (ref 1.005–1.030)
pH: 5 (ref 5.0–8.0)

## 2020-07-06 LAB — CBC
HCT: 40.3 % (ref 36.0–46.0)
Hemoglobin: 13.1 g/dL (ref 12.0–15.0)
MCH: 32.1 pg (ref 26.0–34.0)
MCHC: 32.5 g/dL (ref 30.0–36.0)
MCV: 98.8 fL (ref 80.0–100.0)
Platelets: 477 10*3/uL — ABNORMAL HIGH (ref 150–400)
RBC: 4.08 MIL/uL (ref 3.87–5.11)
RDW: 13.1 % (ref 11.5–15.5)
WBC: 9.9 10*3/uL (ref 4.0–10.5)
nRBC: 0 % (ref 0.0–0.2)

## 2020-07-06 LAB — COMPREHENSIVE METABOLIC PANEL
ALT: 18 U/L (ref 0–44)
AST: 16 U/L (ref 15–41)
Albumin: 3.1 g/dL — ABNORMAL LOW (ref 3.5–5.0)
Alkaline Phosphatase: 81 U/L (ref 38–126)
Anion gap: 12 (ref 5–15)
BUN: 16 mg/dL (ref 6–20)
CO2: 21 mmol/L — ABNORMAL LOW (ref 22–32)
Calcium: 8.8 mg/dL — ABNORMAL LOW (ref 8.9–10.3)
Chloride: 105 mmol/L (ref 98–111)
Creatinine, Ser: 0.7 mg/dL (ref 0.44–1.00)
GFR, Estimated: >60 mL/min (ref >60)
Glucose, Bld: 84 mg/dL (ref 70–99)
Potassium: 4 mmol/L (ref 3.5–5.1)
Sodium: 138 mmol/L (ref 135–145)
Total Bilirubin: 0.6 mg/dL (ref 0.3–1.2)
Total Protein: 6.6 g/dL (ref 6.5–8.1)

## 2020-07-06 MED ORDER — LACTATED RINGERS IV BOLUS
1000.0000 mL | Freq: Once | INTRAVENOUS | Status: AC
  Administered 2020-07-06: 14:00:00 1000 mL via INTRAVENOUS

## 2020-07-06 MED ORDER — KETOROLAC TROMETHAMINE 30 MG/ML IJ SOLN
30.0000 mg | Freq: Once | INTRAMUSCULAR | Status: AC
  Administered 2020-07-06: 14:00:00 30 mg via INTRAVENOUS
  Filled 2020-07-06: qty 1

## 2020-07-06 MED ORDER — METOCLOPRAMIDE HCL 5 MG/ML IJ SOLN
10.0000 mg | Freq: Once | INTRAMUSCULAR | Status: AC
  Administered 2020-07-06: 14:00:00 10 mg via INTRAVENOUS
  Filled 2020-07-06: qty 2

## 2020-07-06 MED ORDER — ACETAMINOPHEN 500 MG PO TABS
1000.0000 mg | ORAL_TABLET | Freq: Once | ORAL | Status: AC
  Administered 2020-07-06: 14:00:00 1000 mg via ORAL
  Filled 2020-07-06: qty 2

## 2020-07-06 NOTE — MAU Provider Note (Signed)
History     [id number]  Arrival date and time: 07/06/20 1300    Chief Complaint  Patient presents with  . Headache     HPI Elizabeth [name] is a 29 y.o. at [redacted]w[redacted]d s/p pLTCS for AoDescent on 06/25/2020, who presents for evaluate of elevated blood pressures and headache.   Review of discharge summary from last admission on 06/28/2020: presented for IOL for PD, labor course c/b Triple I and arrest of dilation, went for uncomplicated pLTCS and had an uncomplicated post partum course. No problems with blood pressure during admission.  On review of chart patient sent mychart message yesterday reporting headache x2 days, had BP taken and was 124/90, instructed to present to MAU for evaluation.   Today reports ongoing headache over the past three days Oxycodone and ibuprofen have not been effective Has tried to get more sleep but that has also not helped Started after she left the hospital Thinks it might be a little worse when she's sitting up and gets better with lying down Had epidural during labor Denies vision changes, chest pain, shortness of breath, RUQ pain, LE edema   --/--/O POS (12/17 1215)  OB History    Gravida  1   Para  1   Term  1   Preterm  0   AB  0   Living  1     SAB  0   IAB  0   Ectopic  0   Multiple  0   Live Births  1           Past Medical History:  Diagnosis Date  . Anxiety     Past Surgical History:  Procedure Laterality Date  . CESAREAN SECTION N/A 06/25/2020   Procedure: CESAREAN SECTION;  Surgeon: Lazaro Arms, MD;  Location: [facility];  Service: Obstetrics;  Laterality: N/A;  . WISDOM TOOTH EXTRACTION      Family History  Problem Relation Age of Onset  . Cancer Maternal Aunt   . Cancer Maternal Grandmother     Social History   Socioeconomic History  . Marital status: Single    Spouse name: Not on file  . Number of children: Not on file  . Years of education: Not on file  . Highest education level: Not on file   Occupational History  . Not on file  Tobacco Use  . Smoking status: Never Smoker  . Smokeless tobacco: Never Used  Vaping Use  . Vaping Use: Never used  Substance and Sexual Activity  . Alcohol use: Not Currently    Comment: social  . Drug use: No  . Sexual activity: Not Currently  Other Topics Concern  . Not on file  Social History Narrative  . Not on file   Social Determinants of Health   Financial Resource Strain: Not on file  Food Insecurity: Not on file  Transportation Needs: Not on file  Physical Activity: Not on file  Stress: Not on file  Social Connections: Not on file  Intimate Partner Violence: Not on file    No Known Allergies  No current facility-administered medications on file prior to encounter.   Current Outpatient Medications on File Prior to Encounter  Medication Sig Dispense Refill  . acetaminophen (TYLENOL) 500 MG tablet Take 2 tablets (1,000 mg total) by mouth every 6 (six) hours. 30 tablet 0  . ibuprofen (ADVIL) 800 MG tablet Take 1 tablet (800 mg total) by mouth every 8 (eight) hours. 30 tablet 0  .  oxyCODONE (OXY IR/ROXICODONE) 5 MG immediate release tablet Take 1 tablet (5 mg total) by mouth every 4 (four) hours as needed for moderate pain. 15 tablet 0  . senna-docusate (SENOKOT-S) 8.6-50 MG tablet Take 2 tablets by mouth daily. 30 tablet 0     ROS Pertinent positives and negative per HPI, all others reviewed and negative  Physical Exam   BP 114/70   Pulse 89   Temp 98.7 F (37.1 C) (Oral)   Resp 16   Wt 96.8 kg   LMP 09/17/2019 (Exact Date)   SpO2 100%   Breastfeeding Yes   BMI 36.64 kg/m   Physical Exam Vitals reviewed.  Constitutional:      General: She is not in acute distress.    Appearance: She is well-developed and well-nourished. She is not diaphoretic.  Eyes:     General: No scleral icterus. Pulmonary:     Effort: Pulmonary effort is normal. No respiratory distress.  Abdominal:     General: There is no distension.      Palpations: Abdomen is soft.     Tenderness: There is no abdominal tenderness. There is no guarding or rebound.  Musculoskeletal:        General: No edema.  Skin:    General: Skin is warm and dry.  Neurological:     Mental Status: She is alert.     Coordination: Coordination normal.  Psychiatric:        Mood and Affect: Mood and affect normal.      Cervical Exam    Bedside Ultrasound Not done  My interpretation: n/a  FHT n/a  Labs Results for orders placed or performed during the hospital encounter of 07/06/20 (from the past 24 hour(s))  Urinalysis, Routine w reflex microscopic Urine, Clean Catch     Status: Abnormal   Collection Time: 07/06/20  1:21 PM  Result Value Ref Range   Color, Urine YELLOW YELLOW   APPearance HAZY (A) CLEAR   Specific Gravity, Urine 1.018 1.005 - 1.030   pH 5.0 5.0 - 8.0   Glucose, UA NEGATIVE NEGATIVE mg/dL   Hgb urine dipstick LARGE (A) NEGATIVE   Bilirubin Urine NEGATIVE NEGATIVE   Ketones, ur NEGATIVE NEGATIVE mg/dL   Protein, ur NEGATIVE NEGATIVE mg/dL   Nitrite NEGATIVE NEGATIVE   Leukocytes,Ua MODERATE (A) NEGATIVE   RBC / HPF 0-5 0 - 5 RBC/hpf   WBC, UA 11-20 0 - 5 WBC/hpf   Bacteria, UA RARE (A) NONE SEEN   Squamous Epithelial / LPF 0-5 0 - 5   Mucus PRESENT   CBC     Status: Abnormal   Collection Time: 07/06/20  1:48 PM  Result Value Ref Range   WBC 9.9 4.0 - 10.5 K/uL   RBC 4.08 3.87 - 5.11 MIL/uL   Hemoglobin 13.1 12.0 - 15.0 g/dL   HCT 26.2 03.5 - 59.7 %   MCV 98.8 80.0 - 100.0 fL   MCH 32.1 26.0 - 34.0 pg   MCHC 32.5 30.0 - 36.0 g/dL   RDW 41.6 38.4 - 53.6 %   Platelets 477 (H) 150 - 400 K/uL   nRBC 0.0 0.0 - 0.2 %  Comprehensive metabolic panel     Status: Abnormal   Collection Time: 07/06/20  1:48 PM  Result Value Ref Range   Sodium 138 135 - 145 mmol/L   Potassium 4.0 3.5 - 5.1 mmol/L   Chloride 105 98 - 111 mmol/L   CO2 21 (L) 22 - 32 mmol/L  Glucose, Bld 84 70 - 99 mg/dL   BUN 16 6 - 20 mg/dL    Creatinine, Ser 8.41 0.44 - 1.00 mg/dL   Calcium 8.8 (L) 8.9 - 10.3 mg/dL   Total Protein 6.6 6.5 - 8.1 g/dL   Albumin 3.1 (L) 3.5 - 5.0 g/dL   AST 16 15 - 41 U/L   ALT 18 0 - 44 U/L   Alkaline Phosphatase 81 38 - 126 U/L   Total Bilirubin 0.6 0.3 - 1.2 mg/dL   GFR, Estimated >66 >06 mL/min   Anion gap 12 5 - 15    Imaging No results found.  MAU Course  Procedures  Lab Orders     Urinalysis, Routine w reflex microscopic Urine, Clean Catch     CBC     Comprehensive metabolic panel Meds ordered this encounter  Medications  . metoCLOPramide (REGLAN) injection 10 mg  . lactated ringers bolus 1,000 mL  . acetaminophen (TYLENOL) tablet 1,000 mg  . ketorolac (TORADOL) 30 MG/ML injection 30 mg   Imaging Orders  No imaging studies ordered today    MDM moderate  Assessment and Plan  #Post partum Headache Some aspects of spinal headache but on review of case w anesthesia seems less likely. BP has been normal throughout stay in MAU, low suspicion for PP HTN disease, and CBC/CMP are unremarkable. Headache improved s/p 1L LR, reglan, toradol, tylenol, likely tension in setting of poor sleep/hydration.  D/c to home in stable condition.    Venora Maples

## 2020-07-06 NOTE — Discharge Instructions (Signed)
Tension Headache, Adult A tension headache is pain, pressure, or aching in your head. Tension headaches can last from 30 minutes to several days. Follow these instructions at home: Managing pain  Take over-the-counter and prescription medicines only as told by your doctor.  When you have a headache, lie down in a dark, quiet room.  If told, put ice on your head and neck: ? Put ice in a plastic bag. ? Place a towel between your skin and the bag. ? Leave the ice on for 20 minutes, 2-3 times a day.  If told, put heat on the back of your neck. Do this as often as your doctor tells you to. Use the kind of heat that your doctor recommends, such as a moist heat pack or a heating pad. ? Place a towel between your skin and the heat. ? Leave the heat on for 20-30 minutes. ? Remove the heat if your skin turns bright red. Eating and drinking  Eat meals on a regular schedule.  Watch how much alcohol you drink: ? If you are a woman and are not pregnant, do not drink more than 1 drink a day. ? If you are a man, do not drink more than 2 drinks a day.  Drink enough fluid to keep your pee (urine) pale yellow.  Do not use a lot of caffeine, or stop using caffeine. Lifestyle  Get enough sleep. Get 7-9 hours of sleep each night. Or get the amount of sleep that your doctor tells you to.  At bedtime, remove all electronic devices from your room. Examples of electronic devices are computers, phones, and tablets.  Find ways to lessen your stress. Some things that can lessen stress are: ? Exercise. ? Deep breathing. ? Yoga. ? Music. ? Positive thoughts.  Sit up straight. Do not tighten (tense) your muscles.  Do not use any products that have nicotine or tobacco in them, such as cigarettes and e-cigarettes. If you need help quitting, ask your doctor. General instructions   Keep all follow-up visits as told by your doctor. This is important.  Avoid things that can bring on headaches. Keep a  journal to find out if certain things bring on headaches. For example, write down: ? What you eat and drink. ? How much sleep you get. ? Any change to your diet or medicines. Contact a doctor if:  Your headache does not get better.  Your headache comes back.  You have a headache and sounds, light, or smells bother you.  You feel sick to your stomach (nauseous) or you throw up (vomit).  Your stomach hurts. Get help right away if:  You suddenly get a very bad headache along with any of these: ? A stiff neck. ? Feeling sick to your stomach. ? Throwing up. ? Feeling weak. ? Trouble seeing. ? Feeling short of breath. ? A rash. ? Feeling unusually sleepy. ? Trouble speaking. ? Pain in your eye or ear. ? Trouble walking or balancing. ? Feeling like you will pass out (faint). ? Passing out. Summary  A tension headache is pain, pressure, or aching in your head.  Tension headaches can last from 30 minutes to several days.  Lifestyle changes and medicines may help relieve pain. This information is not intended to replace advice given to you by your health care provider. Make sure you discuss any questions you have with your health care provider. Document Revised: 04/21/2019 Document Reviewed: 10/04/2016 Elsevier Patient Education  2020 Elsevier Inc.  

## 2020-07-06 NOTE — MAU Note (Signed)
Pt reports a headache that started on Monday afternoon. Pt states she has been taking ibuprofen, oxycodone, and drinking water with no relief. Pt report she is unsure if it is from lack of sleep since her baby does not sleep well.   Pt reports that her mom has been coming to help and even when she takes naps she wakes up and it is still there.

## 2020-08-03 ENCOUNTER — Telehealth: Payer: Self-pay | Admitting: Radiology

## 2020-08-03 ENCOUNTER — Other Ambulatory Visit: Payer: Self-pay

## 2020-08-03 ENCOUNTER — Ambulatory Visit (INDEPENDENT_AMBULATORY_CARE_PROVIDER_SITE_OTHER): Payer: 59 | Admitting: Obstetrics and Gynecology

## 2020-08-03 ENCOUNTER — Encounter: Payer: Self-pay | Admitting: Obstetrics and Gynecology

## 2020-08-03 VITALS — BP 111/76 | HR 96 | Wt 214.6 lb

## 2020-08-03 DIAGNOSIS — Z30430 Encounter for insertion of intrauterine contraceptive device: Secondary | ICD-10-CM

## 2020-08-03 DIAGNOSIS — B372 Candidiasis of skin and nail: Secondary | ICD-10-CM

## 2020-08-03 DIAGNOSIS — F53 Postpartum depression: Secondary | ICD-10-CM

## 2020-08-03 DIAGNOSIS — O99345 Other mental disorders complicating the puerperium: Secondary | ICD-10-CM

## 2020-08-03 HISTORY — PX: IUD INSERTION: OBO1003

## 2020-08-03 MED ORDER — LEVONORGESTREL 20 MCG/24HR IU IUD: INTRAUTERINE_SYSTEM | Freq: Once | INTRAUTERINE | Status: AC

## 2020-08-03 NOTE — Progress Notes (Signed)
    Post Partum Visit Note  Elizabeth [name] is a 30 y.o. G39P1001 female who presents for a postpartum visit. She is 6 weeks postpartum following a primary cesarean section.  I have fully reviewed the prenatal and intrapartum course. The delivery was at [redacted]w[redacted]d gestational weeks.  Anesthesia: epidural. Postpartum course has been uncomplicated. Baby is doing well. Baby is feeding by breast. Bleeding no bleeding. Bowel function is normal. Bladder function is normal. Patient is not sexually active. Contraception method is none. Postpartum depression screening: positive.  Pap negative 2020.   The pregnancy intention screening data noted above was reviewed. Potential methods of contraception were discussed. The patient elected to proceed with IUD or IUS. She has used IUDs before and was amenorrheic on it. She would like a Mirena.    Edinburgh Postnatal Depression Scale - 08/03/20 1127      Edinburgh Postnatal Depression Scale:  In the Past 7 Days   I have been able to laugh and see the funny side of things. 0    I have looked forward with enjoyment to things. 0    I have blamed myself unnecessarily when things went wrong. 2    I have been anxious or worried for no good reason. 3    I have felt scared or panicky for no good reason. 1    Things have been getting on top of me. 2    I have been so unhappy that I have had difficulty sleeping. 0    I have felt sad or miserable. 2    I have been so unhappy that I have been crying. 2   Crying because baby is fussy   The thought of harming myself has occurred to me. 0    Edinburgh Postnatal Depression Scale Total 12            Review of Systems Pertinent items are noted in HPI.    Objective:  BP 111/76   Pulse 96   Wt 214 lb 9.6 oz (97.3 kg)   BMI 36.84 kg/m   NAD Abdomen: c/d/i incision with slight skin separation at the left lateral edge (subcm). Old suture removed. Some mild erythema at spots on the incision  Pelvic Egbus, vagina and cervix  normal See procedure note for uncomplicated mirena iud insertion.   Assessment:    Normal postpartum exam.  Plan:  *PP: routine care. Recommend keeping incision, below pannus c/d and using an antifungal cream/ointment bid x 2 weeks.  *PP depression: infant fussy at night and takes a breast milk from a bottle but not the breast. Strategies d/w her to try and help. Fiance is in [employer] and wasn't able to take time off for the baby. Her mom does help some. She has pre-existing anxiety. I d/w her importance of using any help as possible from people close to her and doing self care and other strategies. I recommended seeing BH  RTC 2 weeks for mood check and 4 weeks for string check.   [place] Bing, MD [facility] [employer], [facility]

## 2020-08-03 NOTE — Procedures (Signed)
Intrauterine Device (IUD) Insertion Procedure Note  After a  written consent was obtained. The patient understands the risks of IUD placement, which include but are not limited to: bleeding, infection, uterine perforation, risk of expulsion, risk of failure < 1%, increased risk of ectopic pregnancy in the event of failure.   Prior to the procedure being performed, the patient was asked to state their full name, date of birth, and the type of procedure being performed. A bimanual exam showed the uterus to be midposition.  Next, the cervix and vagina were cleaned with an antiseptic solution, and the cervix was grasped with a tenaculum.  The uterus was sounded to 7 cm.  The Mirena was placed without difficulty in the usual fashion.  The strings were cut to 3-4 cm.  The tenaculum was removed and cervix was found to be hemostatic.    No complications, patient tolerated the procedure well.  Cornelia Copa MD Attending [facility] [employer] [facility])

## 2020-08-03 NOTE — Telephone Encounter (Signed)
Left message for patient to call [facility] to schedule appointment for mood check in 2 weeks and string check in 4 weeks.

## 2020-08-15 ENCOUNTER — Telehealth: Payer: Self-pay

## 2020-08-15 NOTE — Telephone Encounter (Signed)
Pt returning phone called. Called pt to check on her in regards to depression screen at [facility] visit. Pt states she is doing much better since switching baby to formula. Pt does not need mood check appt, we will see her in office for string check in 3 weeks.

## 2020-08-15 NOTE — Telephone Encounter (Signed)
Left message for pt to call office

## 2020-08-22 ENCOUNTER — Ambulatory Visit: Payer: 59 | Admitting: Obstetrics & Gynecology

## 2020-08-31 ENCOUNTER — Encounter: Payer: Self-pay | Admitting: Obstetrics & Gynecology

## 2020-08-31 ENCOUNTER — Ambulatory Visit (INDEPENDENT_AMBULATORY_CARE_PROVIDER_SITE_OTHER): Payer: 59 | Admitting: Obstetrics & Gynecology

## 2020-08-31 ENCOUNTER — Other Ambulatory Visit: Payer: Self-pay

## 2020-08-31 VITALS — BP 117/81 | HR 80 | Wt 220.4 lb

## 2020-08-31 DIAGNOSIS — F419 Anxiety disorder, unspecified: Secondary | ICD-10-CM

## 2020-08-31 DIAGNOSIS — Z30431 Encounter for routine checking of intrauterine contraceptive device: Secondary | ICD-10-CM | POA: Diagnosis not present

## 2020-08-31 MED ORDER — BUSPIRONE HCL 10 MG PO TABS: 10.0000 mg | ORAL_TABLET | Freq: Two times a day (BID) | ORAL | 2 refills | Status: DC

## 2020-08-31 NOTE — Progress Notes (Signed)
GAD 7: 5 Pt c/o of feeling anxious and worried, especially about baby. Pt reports having anxiety before pregnancy - took antidepressants and felt things were worse.

## 2020-08-31 NOTE — Progress Notes (Signed)
    GYNECOLOGY OFFICE ENCOUNTER NOTE  History:  30 y.o. G1P1001 here today for today for IUD string check; Mirena  IUD was placed 08/03/2020. No complaints about the IUD, no concerning side effects. Reports increased anxiety, desires intervention for this.  No SI/HI.  The following portions of the patient's history were reviewed and updated as appropriate: allergies, current medications, past family history, past medical history, past social history, past surgical history and problem list. Last pap smear on 02/18/2019 was normal.  Review of Systems:  Pertinent items are noted in HPI.   Objective:  Physical Exam Blood pressure 117/81, pulse 80, weight 220 lb 6.4 oz (100 kg), currently breastfeeding. CONSTITUTIONAL: Well-developed, well-nourished female in no acute distress.  HENT:  Normocephalic, atraumatic. External right and left ear normal. Oropharynx is clear and moist EYES: Conjunctivae and EOM are normal. Pupils are equal, round, and reactive to light. No scleral icterus.  NECK: Normal range of motion, supple, no masses CARDIOVASCULAR: Normal heart rate noted RESPIRATORY: Effort and breath sounds normal, no problems with respiration noted ABDOMEN: Soft, no distention noted.   PELVIC: Normal appearing external genitalia; normal appearing vaginal mucosa and cervix.  IUD strings visualized, about 2 cm in length outside cervix.   Assessment & Plan:  Patient to keep IUD in place for up to seven years; can come in for removal if she desires pregnancy earlier or for any concerning side effects.  As for her anxiety, recommended referral to [facility] clinician.  Patient verbally consented to [facility] services about presenting concerns and psychiatric consultation as appropriate. Buspar also prescribed for now.   I spent 20 minutes dedicated to the care of this patient including pre-visit review of records, face to face time with the patient discussing her conditions and  treatments and post visit ordering of testing.   Jaynie Collins, MD, FACOG Obstetrician & Gynecologist, [facility] [employer], [facility]

## 2020-09-12 ENCOUNTER — Ambulatory Visit (INDEPENDENT_AMBULATORY_CARE_PROVIDER_SITE_OTHER): Payer: 59 | Admitting: Licensed Clinical Social Worker

## 2020-09-12 DIAGNOSIS — F4322 Adjustment disorder with anxiety: Secondary | ICD-10-CM

## 2020-09-12 NOTE — BH Specialist Note (Signed)
Integrated Behavioral Health via Telemedicine Visit  09/12/2020 Elizabeth [name] [id number]  Number of Integrated Behavioral Health visits: 1/6 Session Start time: 1:04pm   Session End time: 1:22pm Total time: 18 mins via mychart video   Referring Provider: Dr. Gladis Riffle MD  Patient/Family location: Home  [facility] Provider location: [facility]  All persons participating in visit: Pt Elizabeth [name] and LCSWA A.Shruthi Northrup  Types of Service: Individual psychotherapy   I connected with Elizabeth [name] and/or Elizabeth [name] n/a via  Telephone or Video Enabled Telemedicine Application  (Video is Caregility application) and verified that I am speaking with the correct person using two identifiers. Discussed confidentiality: yes  I discussed the limitations of telemedicine and the availability of in person appointments.  Discussed there is a possibility of technology failure and discussed alternative modes of communication if that failure occurs.  I discussed that engaging in this telemedicine visit, they consent to the provision of behavioral healthcare and the services will be billed under their insurance.  Patient and/or legal guardian expressed understanding and consented to Telemedicine visit: yes  Presenting Concerns: Patient and/or family reports the following symptoms/concerns: anxiety Duration of problem: over one year increased since delivery ; Severity of problem: mild  Patient and/or Family's Strengths/Protective Factors: Secured connections in place   Goals Addressed: Patient will: 1.  Reduce symptoms of: anxiety   2.  Increase knowledge and/or ability of: coping skills   3.  Demonstrate ability to: increase knowledge of coping skills to alleviate and manage symptoms   Progress towards Goals: Ongoing   Interventions: Interventions utilized:  Supportive counseling  Standardized Assessments completed: n/a  Assessment: Patient currently experiencing adjustment disorder with anxiety  Patient may  benefit from integrated behavioral health   Plan: 1. Follow up with behavioral health clinician on : 2 weeks via mychart  2. Behavioral recommendations: Continue taking prescribed medication as directed, engage in stress reducing activity, prioritze rest and self care  3. Referral(s): n/a  I discussed the assessment and treatment plan with the patient and/or parent/guardian. They were provided an opportunity to ask questions and all were answered. They agreed with the plan and demonstrated an understanding of the instructions.   They were advised to call back or seek an in-person evaluation if the symptoms worsen or if the condition fails to improve as anticipated.  Gwyndolyn Saxon, LCSW

## 2020-09-14 ENCOUNTER — Other Ambulatory Visit: Payer: Self-pay | Admitting: *Deleted

## 2020-09-14 MED ORDER — SERTRALINE HCL 50 MG PO TABS: 50.0000 mg | ORAL_TABLET | Freq: Every day | ORAL | 2 refills | Status: DC

## 2020-10-16 ENCOUNTER — Other Ambulatory Visit: Payer: Self-pay

## 2020-10-16 ENCOUNTER — Ambulatory Visit (INDEPENDENT_AMBULATORY_CARE_PROVIDER_SITE_OTHER): Payer: 59 | Admitting: Family Medicine

## 2020-10-16 ENCOUNTER — Encounter: Payer: Self-pay | Admitting: Family Medicine

## 2020-10-16 VITALS — BP 123/80 | HR 108 | Wt 221.0 lb

## 2020-10-16 DIAGNOSIS — F419 Anxiety disorder, unspecified: Secondary | ICD-10-CM

## 2020-10-16 MED ORDER — SERTRALINE HCL 100 MG PO TABS: 100.0000 mg | ORAL_TABLET | Freq: Every day | ORAL | 1 refills | Status: DC

## 2020-10-16 NOTE — Patient Instructions (Signed)
[email or web address]">  Generalized Anxiety Disorder, Adult Generalized anxiety disorder (GAD) is a mental health condition. Unlike normal worries, anxiety related to GAD is not triggered by a specific event. These worries do not fade or get better with time. GAD interferes with relationships, work, and school. GAD symptoms can vary from mild to severe. People with severe GAD can have intense waves of anxiety with physical symptoms that are similar to panic attacks. What are the causes? The exact cause of GAD is not known, but the following are believed to have an impact:  Differences in natural brain chemicals.  Genes passed down from parents to children.  Differences in the way threats are perceived.  Development during childhood.  Personality. What increases the risk? The following factors may make you more likely to develop this condition:  Being female.  Having a family history of anxiety disorders.  Being very shy.  Experiencing very stressful life events, such as the death of a loved one.  Having a very stressful family environment. What are the signs or symptoms? People with GAD often worry excessively about many things in their lives, such as their health and family. Symptoms may also include:  Mental and emotional symptoms: ? Worrying excessively about natural disasters. ? Fear of being late. ? Difficulty concentrating. ? Fears that others are judging your performance.  Physical symptoms: ? Fatigue. ? Headaches, muscle tension, muscle twitches, trembling, or feeling shaky. ? Feeling like your heart is pounding or beating very fast. ? Feeling out of breath or like you cannot take a deep breath. ? Having trouble falling asleep or staying asleep, or experiencing restlessness. ? Sweating. ? Nausea, diarrhea, or irritable bowel syndrome (IBS).  Behavioral symptoms: ? Experiencing erratic moods or irritability. ? Avoidance of new situations. ? Avoidance of  people. ? Extreme difficulty making decisions. How is this diagnosed? This condition is diagnosed based on your symptoms and medical history. You will also have a physical exam. Your health care provider may perform tests to rule out other possible causes of your symptoms. To be diagnosed with GAD, a person must have anxiety that:  Is out of his or her control.  Affects several different aspects of his or her life, such as work and relationships.  Causes distress that makes him or her unable to take part in normal activities.  Includes at least three symptoms of GAD, such as restlessness, fatigue, trouble concentrating, irritability, muscle tension, or sleep problems. Before your health care provider can confirm a diagnosis of GAD, these symptoms must be present more days than they are not, and they must last for 6 months or longer. How is this treated? This condition may be treated with:  Medicine. Antidepressant medicine is usually prescribed for long-term daily control. Anti-anxiety medicines may be added in severe cases, especially when panic attacks occur.  Talk therapy (psychotherapy). Certain types of talk therapy can be helpful in treating GAD by providing support, education, and guidance. Options include: ? Cognitive behavioral therapy (CBT). People learn coping skills and self-calming techniques to ease their physical symptoms. They learn to identify unrealistic thoughts and behaviors and to replace them with more appropriate thoughts and behaviors. ? Acceptance and commitment therapy (ACT). This treatment teaches people how to be mindful as a way to cope with unwanted thoughts and feelings. ? Biofeedback. This process trains you to manage your body's response (physiological response) through breathing techniques and relaxation methods. You will work with a therapist while machines are used to monitor your physical   symptoms.  Stress management techniques. These include yoga,  meditation, and exercise. A mental health specialist can help determine which treatment is best for you. Some people see improvement with one type of therapy. However, other people require a combination of therapies.   Follow these instructions at home: Lifestyle  Maintain a consistent routine and schedule.  Anticipate stressful situations. Create a plan, and allow extra time to work with your plan.  Practice stress management or self-calming techniques that you have learned from your therapist or your health care provider. General instructions  Take over-the-counter and prescription medicines only as told by your health care provider.  Understand that you are likely to have setbacks. Accept this and be kind to yourself as you persist to take better care of yourself.  Recognize and accept your accomplishments, even if you judge them as small.  Keep all follow-up visits as told by your health care provider. This is important. Contact a health care provider if:  Your symptoms do not get better.  Your symptoms get worse.  You have signs of depression, such as: ? A persistently sad or irritable mood. ? Loss of enjoyment in activities that used to bring you joy. ? Change in weight or eating. ? Changes in sleeping habits. ? Avoiding friends or family members. ? Loss of energy for normal tasks. ? Feelings of guilt or worthlessness. Get help right away if:  You have serious thoughts about hurting yourself or others. If you ever feel like you may hurt yourself or others, or have thoughts about taking your own life, get help right away. Go to your nearest emergency department or:  Call your local emergency services (911 in the [place]  Call a suicide crisis helpline, such as the National Suicide Prevention Lifeline at 1-800-273-8255. This is open 24 hours a day in the [place]  Text the Crisis Text Line at 741741 (in the [place] Summary  Generalized anxiety disorder (GAD) is a mental  health condition that involves worry that is not triggered by a specific event.  People with GAD often worry excessively about many things in their lives, such as their health and family.  GAD may cause symptoms such as restlessness, trouble concentrating, sleep problems, frequent sweating, nausea, diarrhea, headaches, and trembling or muscle twitching.  A mental health specialist can help determine which treatment is best for you. Some people see improvement with one type of therapy. However, other people require a combination of therapies. This information is not intended to replace advice given to you by your health care provider. Make sure you discuss any questions you have with your health care provider. Document Revised: 04/14/2019 Document Reviewed: 04/14/2019 Elsevier Patient Education  2021 Elsevier Inc.  

## 2020-10-16 NOTE — Assessment & Plan Note (Addendum)
Minimally improved. Increase her Zoloft to 100 mg. May see [facility] specialist as needed.

## 2020-10-16 NOTE — Progress Notes (Signed)
   Subjective:    Patient ID: Elizabeth [name] is a 29 y.o. female presenting with Follow-up (Depression med)  on 10/16/2020  HPI: Patient recently started on zoloft 50 mg. After about 4 weeks, she is noticing some lsight improvement in her symptoms. She is no longer waking up in panic. Tolerating meds with limited side effects.  Review of Systems  Constitutional: Negative for chills and fever.  Respiratory: Negative for shortness of breath.   Cardiovascular: Negative for chest pain.  Gastrointestinal: Negative for abdominal pain, nausea and vomiting.  Genitourinary: Negative for dysuria.  Skin: Negative for rash.      Objective:    BP 123/80   Pulse (!) 108   Wt 221 lb (100.2 kg)   BMI 37.93 kg/m  Physical Exam Constitutional:      General: She is not in acute distress.    Appearance: She is well-developed.  HENT:     Head: Normocephalic and atraumatic.  Eyes:     General: No scleral icterus. Cardiovascular:     Rate and Rhythm: Normal rate.  Pulmonary:     Effort: Pulmonary effort is normal.  Abdominal:     Palpations: Abdomen is soft.  Musculoskeletal:     Cervical back: Neck supple.  Skin:    General: Skin is warm and dry.  Neurological:     Mental Status: She is alert and oriented to person, place, and time.         Assessment & Plan:   Problem List Items Addressed This Visit      Unprioritized   Anxiety - Primary    Minimally improved. Increase her Zoloft to 100 mg. May see [facility] specialist as needed.      Relevant Medications   sertraline (ZOLOFT) 100 MG tablet      Return in about 6 weeks (around 11/27/2020) for virtual, a follow-up.  Reva Bores 10/16/2020 3:30 PM

## 2020-11-30 ENCOUNTER — Encounter: Payer: Self-pay | Admitting: Family Medicine

## 2020-11-30 ENCOUNTER — Telehealth (INDEPENDENT_AMBULATORY_CARE_PROVIDER_SITE_OTHER): Payer: 59 | Admitting: Family Medicine

## 2020-11-30 ENCOUNTER — Other Ambulatory Visit: Payer: Self-pay

## 2020-11-30 DIAGNOSIS — F53 Postpartum depression: Secondary | ICD-10-CM | POA: Diagnosis not present

## 2020-11-30 DIAGNOSIS — F419 Anxiety disorder, unspecified: Secondary | ICD-10-CM | POA: Diagnosis not present

## 2020-11-30 MED ORDER — SERTRALINE HCL 100 MG PO TABS: 150.0000 mg | ORAL_TABLET | Freq: Every day | ORAL | 1 refills | Status: DC

## 2020-11-30 NOTE — Assessment & Plan Note (Signed)
Sleeping better and mood is not worse. Increased anxiety but much better than before around the baby moving more.

## 2020-11-30 NOTE — Progress Notes (Signed)
    GYNECOLOGY VIRTUAL VISIT ENCOUNTER NOTE  Provider location: [facility]   Patient location: Home  I connected with Elizabeth [name] on 11/30/20 at  4:15 PM EDT by MyChart Video Encounter and verified that I am speaking with the correct person using two identifiers.   I discussed the limitations, risks, security and privacy concerns of performing an evaluation and management service virtually and the availability of in person appointments. I also discussed with the patient that there may be a patient responsible charge related to this service. The patient expressed understanding and agreed to proceed.   History:  Elizabeth [name] is a 30 y.o. G13P1001 female being evaluated today for depression.On Zoloft at 100 mg. She would like to increase. Most symptoms are improved though not completely better. She denies any abnormal vaginal discharge, bleeding, pelvic pain or other concerns.       Past Medical History:  Diagnosis Date  . Anxiety    Past Surgical History:  Procedure Laterality Date  . CESAREAN SECTION N/A 06/25/2020   Procedure: CESAREAN SECTION;  Surgeon: Lazaro Arms, MD;  Location: [facility] LD ORS;  Service: Obstetrics;  Laterality: N/A;  . IUD INSERTION  08/03/2020      . WISDOM TOOTH EXTRACTION     The following portions of the patient's history were reviewed and updated as appropriate: allergies, current medications, past family history, past medical history, past social history, past surgical history and problem list.   Health Maintenance:  Normal pap and negative HRHPV on 02/18/2019.    Review of Systems:  Pertinent items noted in HPI and remainder of comprehensive ROS otherwise negative.  Physical Exam:   General:  Alert, oriented and cooperative. Patient appears to be in no acute distress.  Mental Status: Normal mood and affect. Normal behavior. Normal judgment and thought content.   Respiratory: Normal respiratory effort, no problems with  respiration noted  Rest of physical exam deferred due to type of encounter    Assessment and Plan:     Problem List Items Addressed This Visit      Unprioritized   Anxiety   Relevant Medications   sertraline (ZOLOFT) 100 MG tablet   Postpartum depression - Primary    Sleeping better and mood is not worse. Increased anxiety but much better than before around the baby moving more.      Relevant Medications   sertraline (ZOLOFT) 100 MG tablet           I discussed the assessment and treatment plan with the patient. The patient was provided an opportunity to ask questions and all were answered. The patient agreed with the plan and demonstrated an understanding of the instructions.   The patient was advised to call back or seek an in-person evaluation/go to the ED if the symptoms worsen or if the condition fails to improve as anticipated.  I provided 8 minutes of face-to-face time during this encounter.   Reva Bores, MD [facility] for [employer], [facility]

## 2021-04-06 ENCOUNTER — Encounter: Payer: Self-pay | Admitting: Radiology

## 2021-08-20 IMAGING — US US MFM OB DETAIL+14 WK
1 series · 13 of 28 positions shown · non-contrast
Comparison: none

[Series 1: us mfm ob detail+14 wk · 86 acquisitions, 13 frames shown]
[im 4/86]
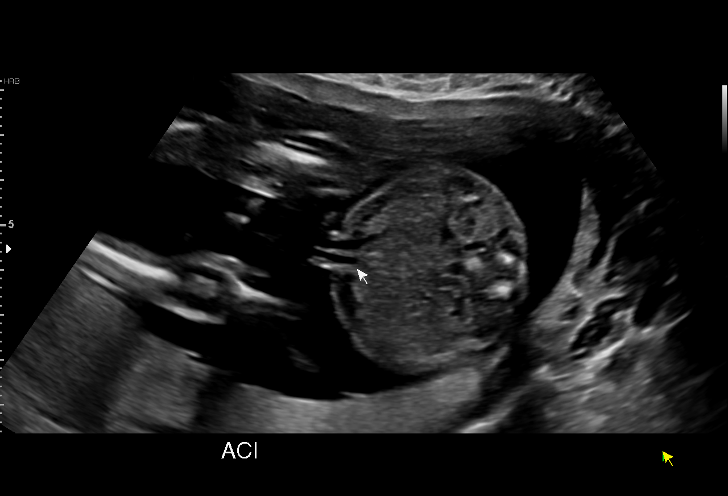
[im 10/86]
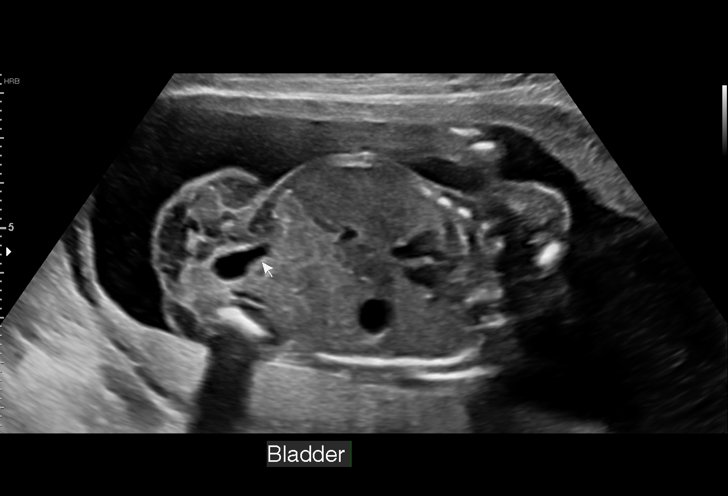
[im 16/86]
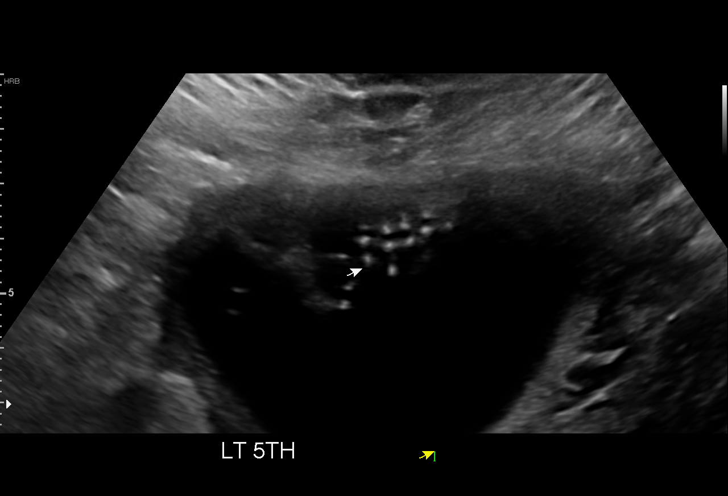
[im 23/86]
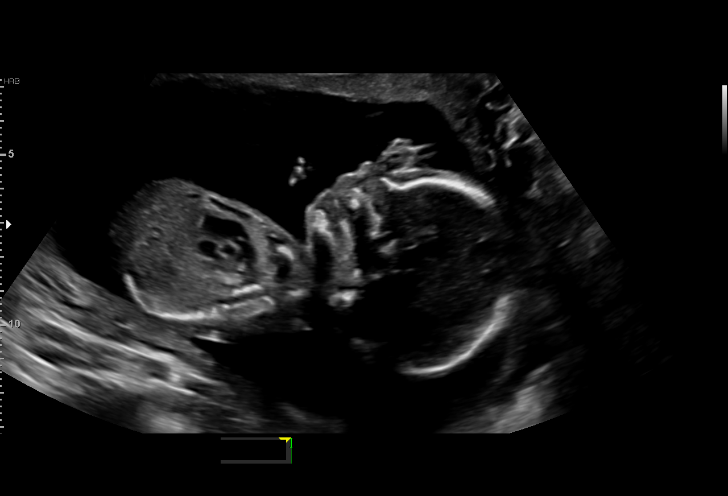
[im 29/86]
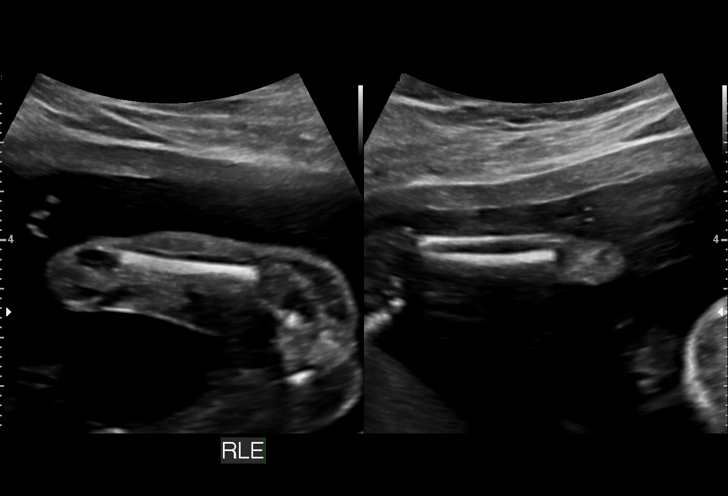
[im 35/86]
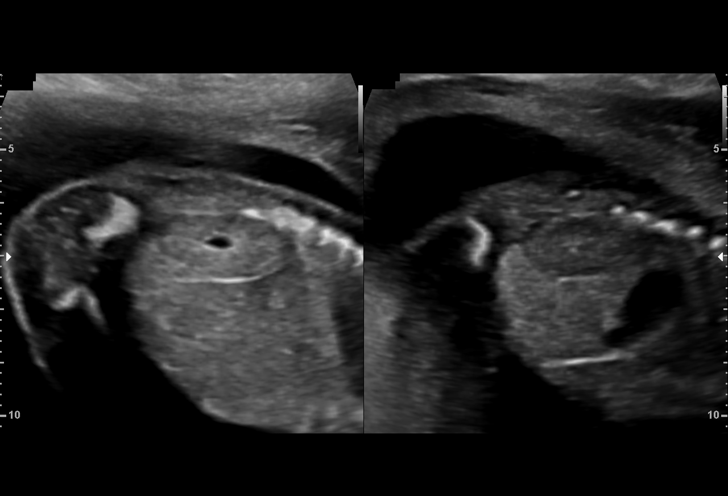
[im 45/86]
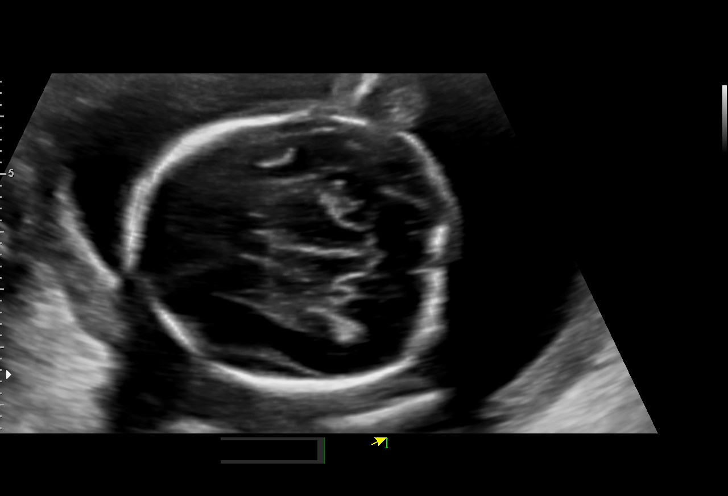
[im 51/86]
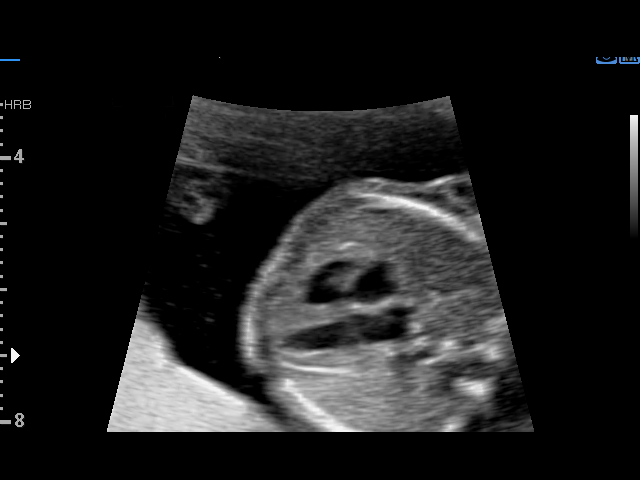
[im 57/86]
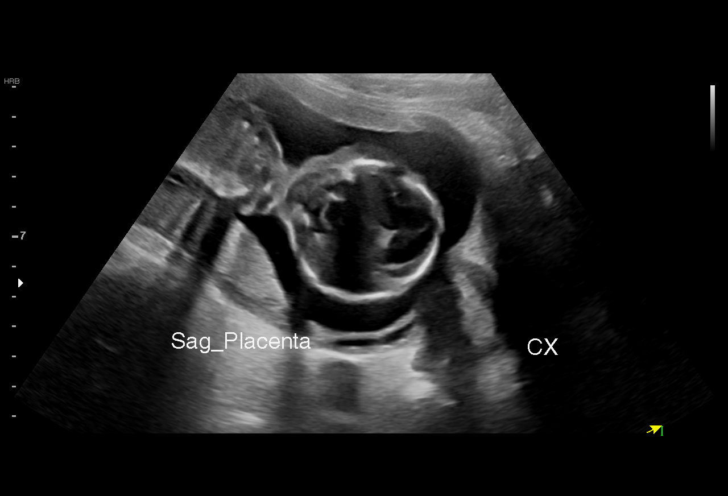
[im 63/86]
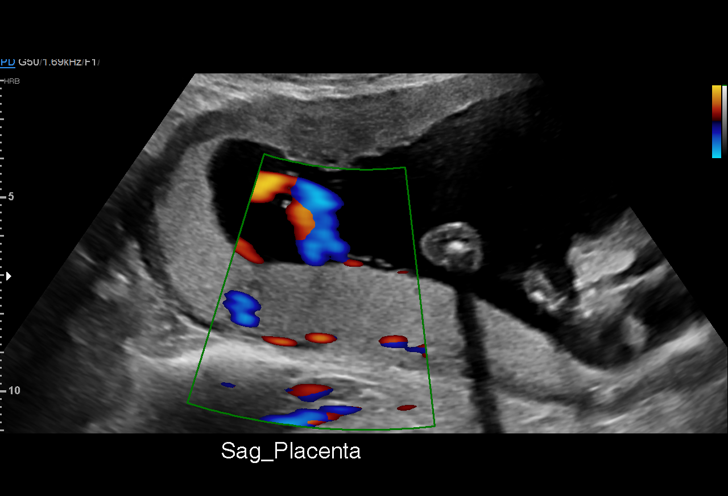
[im 70/86]
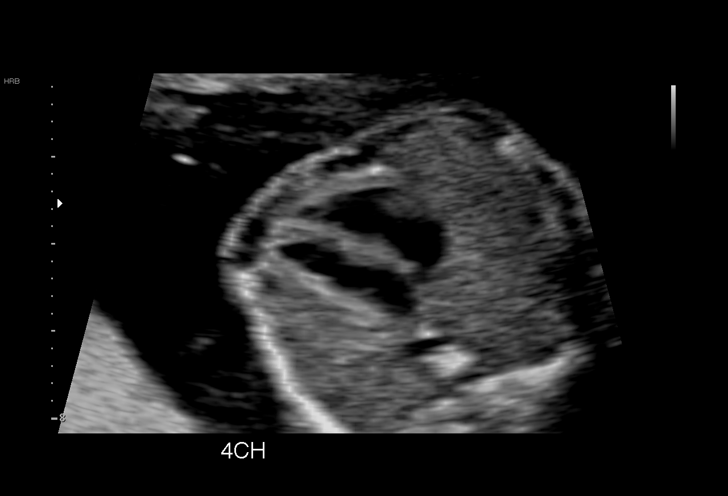
[im 76/86]
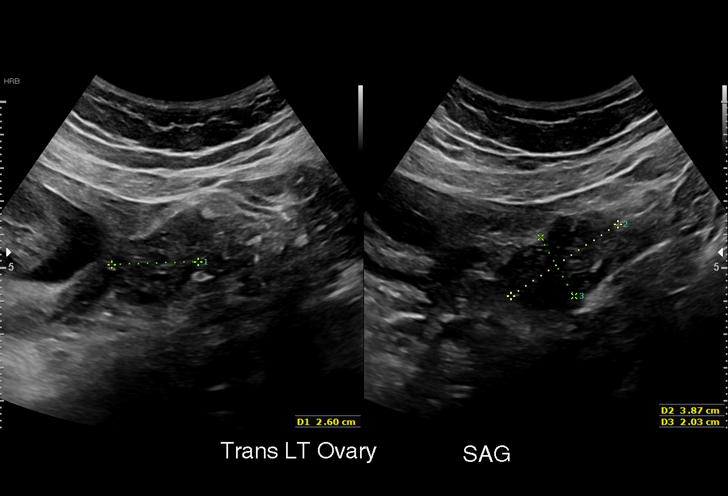
[im 82/86]
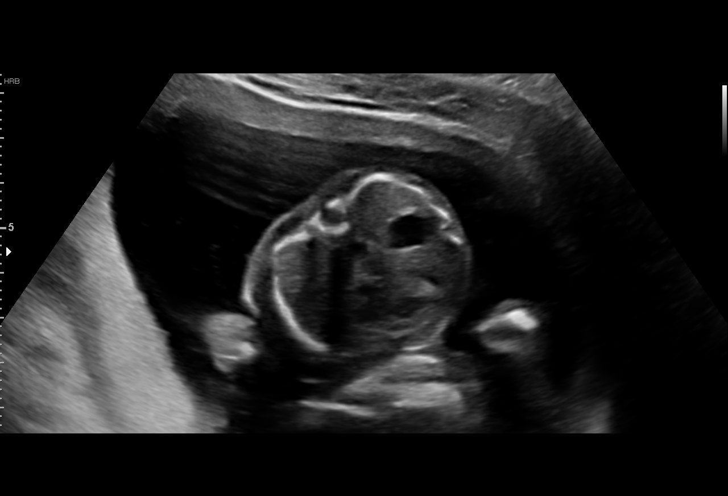

[13 of 28 positions shown; findings below may reference images not displayed]

Indications

 Encounter for antenatal screening for
 malformations (low risk NIPS)
 Obesity complicating pregnancy, second
 trimester (BMI 34)
 19 weeks gestation of pregnancy
Vital Signs

 BMI:
Fetal Evaluation

 Num Of Fetuses:         1
 Fetal Heart Rate(bpm):  151
 Cardiac Activity:       Observed
 Presentation:           Cephalic
 Placenta:               Posterior
 P. Cord Insertion:      Visualized, central

 Amniotic Fluid
 AFI FV:      Within normal limits

                             Largest Pocket(cm)

Biometry
 BPD:      47.1  mm     G. Age:  20w 2d         82  %    CI:        77.23   %    70 - 86
                                                         FL/HC:      17.3   %    16.1 -
 HC:      169.7  mm     G. Age:  19w 4d         50  %    HC/AC:      1.10        1.09 -
 AC:      154.5  mm     G. Age:  20w 4d         82  %    FL/BPD:     62.2   %
 FL:       29.3  mm     G. Age:  19w 0d         28  %    FL/AC:      19.0   %    20 - 24
 CER:      19.6  mm     G. Age:  19w 0d         39  %
 NFT:       2.9  mm
 LV:        5.5  mm
 CM:        4.1  mm
 Est. FW:     318  gm    0 lb 11 oz      72  %
OB History

 Gravidity:    1
Gestational Age

 LMP:           19w 3d        Date:  09/17/19                 EDD:   06/23/20
 U/S Today:     19w 6d                                        EDD:   06/20/20
 Best:          19w 3d     Det. By:  LMP  (09/17/19)          EDD:   06/23/20
Anatomy

 Cranium:               Appears normal         Aortic Arch:            Appears normal
 Cavum:                 Appears normal         Ductal Arch:            Appears normal
 Ventricles:            Appears normal         Diaphragm:              Appears normal
 Choroid Plexus:        Appears normal         Stomach:                Appears normal, left
                                                                       sided
 Cerebellum:            Appears normal         Abdomen:                Appears normal
 Posterior Fossa:       Appears normal         Abdominal Wall:         Appears nml (cord
                                                                       insert, abd wall)
 Nuchal Fold:           Appears normal         Cord Vessels:           Appears normal (3
                                                                       vessel cord)
 Face:                  Appears normal         Kidneys:                Appear normal
                        (orbits and profile)
 Lips:                  Appears normal         Bladder:                Appears normal
 Thoracic:              Appears normal         Spine:                  Appears normal
 Heart:                 Appears normal         Upper Extremities:      Appears normal
                        (4CH, axis, and
                        situs)
 RVOT:                  Appears normal         Lower Extremities:      Appears normal
 LVOT:                  Appears normal

 Other:  Fetus appears to be a male. Heels and 5th digit visualized. Nasal
         bone visualized. Open hands visualized. 3VV visualized. Technically
         difficult due to fetal position.
Cervix Uterus Adnexa

 Cervix
 Length:           3.44  cm.
 Normal appearance by transabdominal scan.

 Uterus
 No abnormality visualized.
 Right Ovary
 Within normal limits.

 Left Ovary
 Within normal limits.

 Cul De Sac
 No free fluid seen.

 Adnexa
 No abnormality visualized.
Impression

 Patient is here for fetal anatomy scan.

 On cell-free fetal DNA screening, the risks of fetal
 aneuploidies are not increased .

 We performed fetal anatomy scan. No makers of
 aneuploidies or fetal structural defects are seen. Fetal
 biometry is consistent with her previously-established dates.
 Amniotic fluid is normal and good fetal activity is seen.
 Patient understands the limitations of ultrasound in detecting
 fetal anomalies.
Recommendations

 Follow-up scans as clinically indicated.
                 Perez Perez, Ilya

## 2022-01-03 IMAGING — US US MFM OB FOLLOW-UP
1 series · 14 of 28 positions shown · non-contrast
Comparison: none

[Series 1: us mfm ob follow-up · 37 acquisitions, 14 frames shown]
[im 2/37]
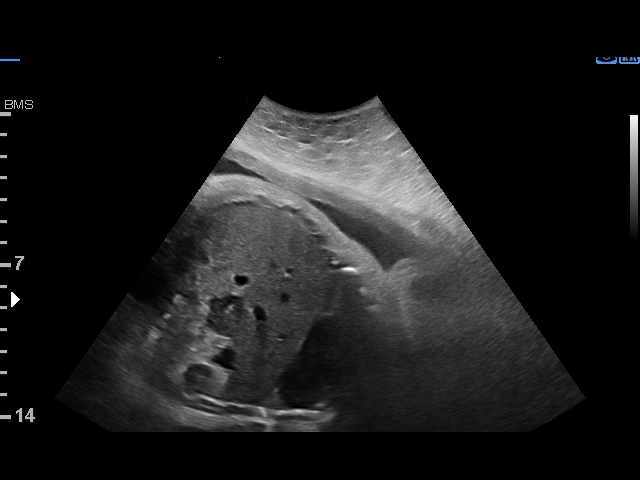
[im 5/37]
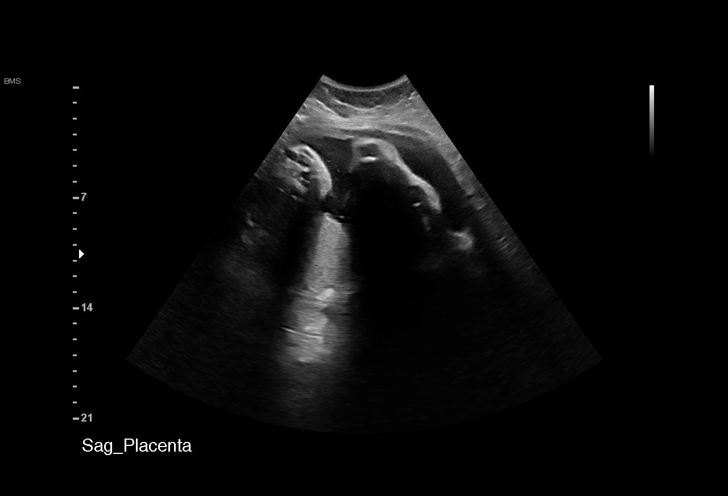
[im 7/37]
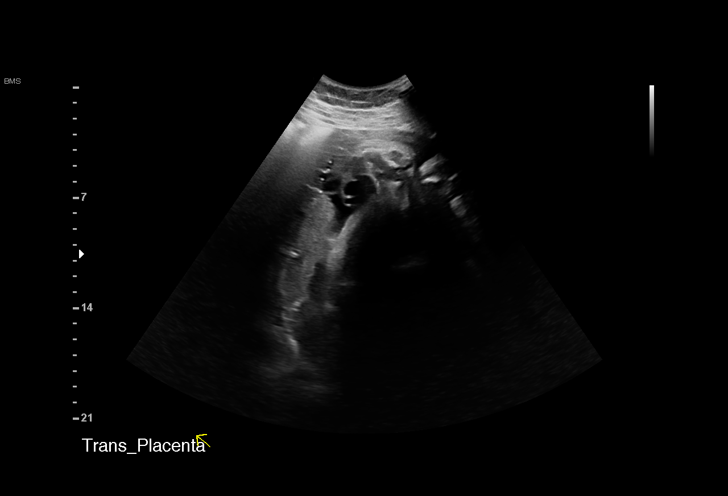
[im 10/37]
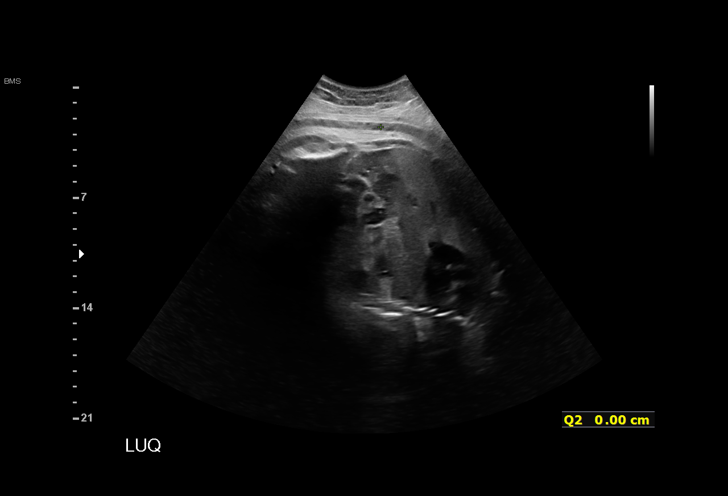
[im 13/37]
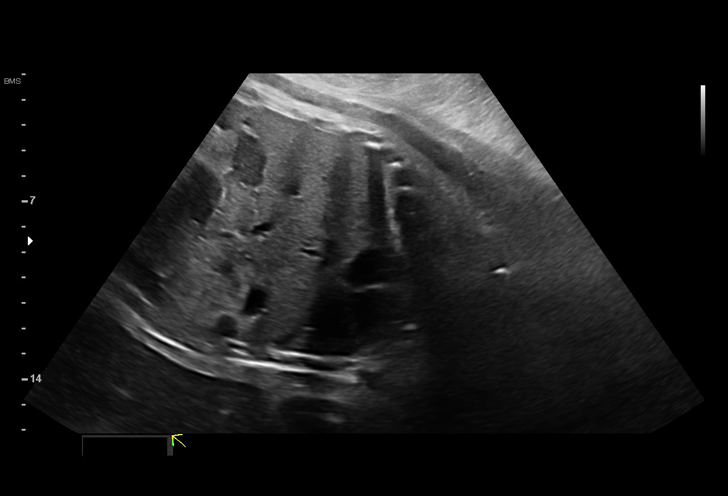
[im 15/37]
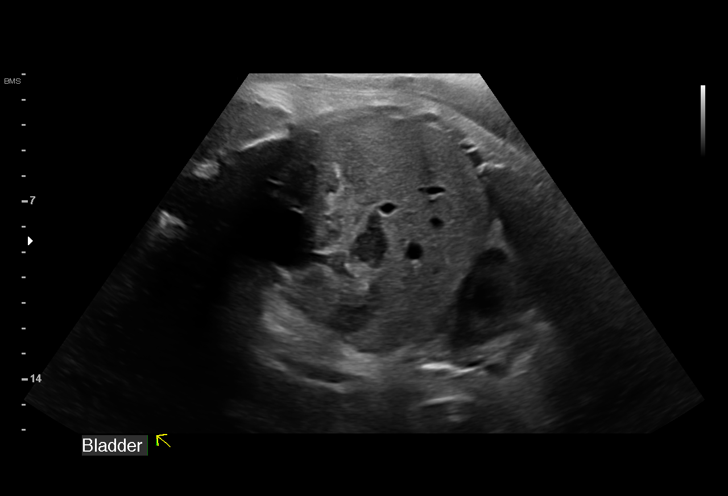
[im 18/37]
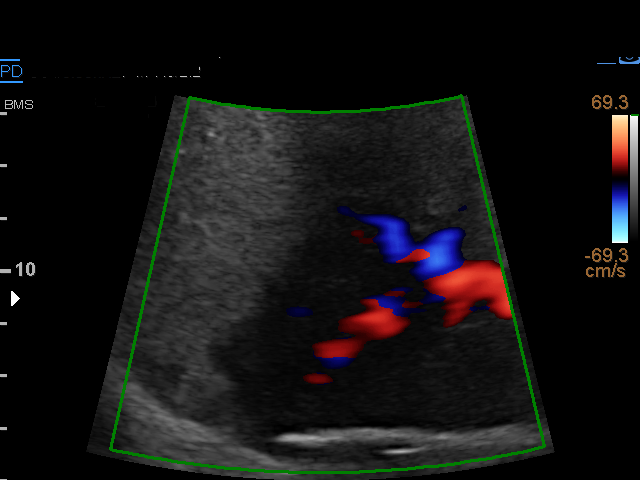
[im 21/37]
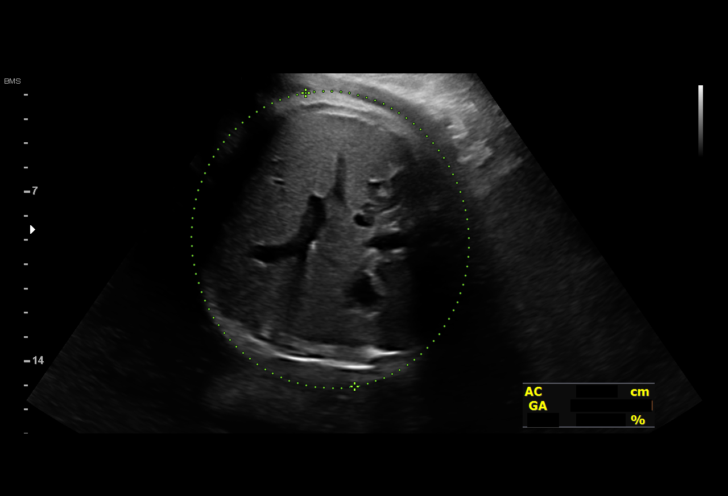
[im 23/37]
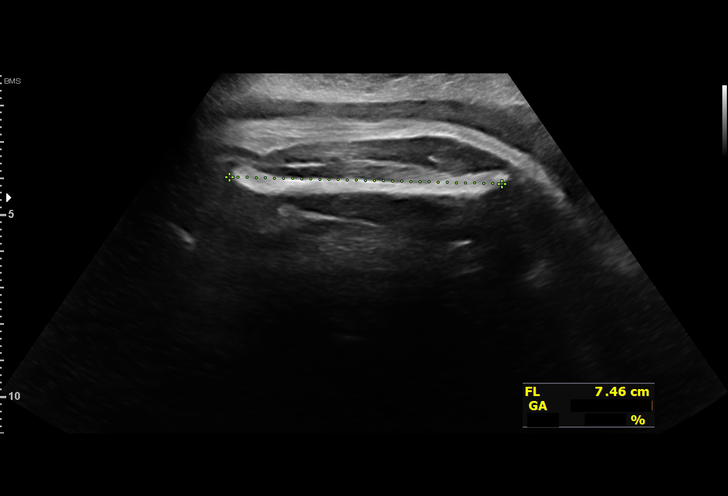
[im 26/37]
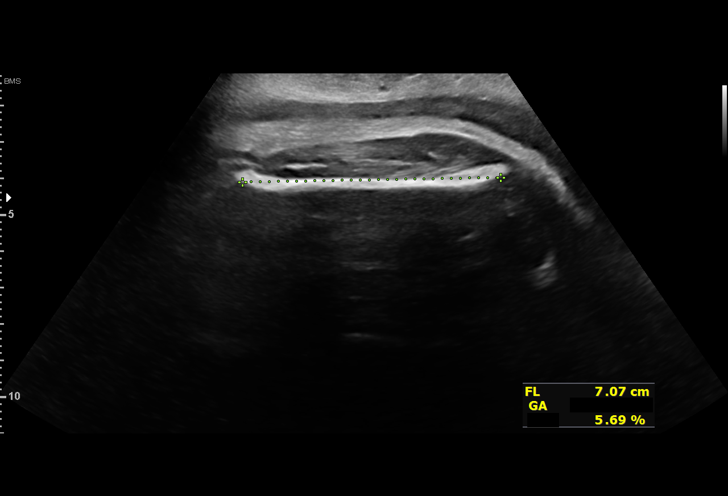
[im 29/37]
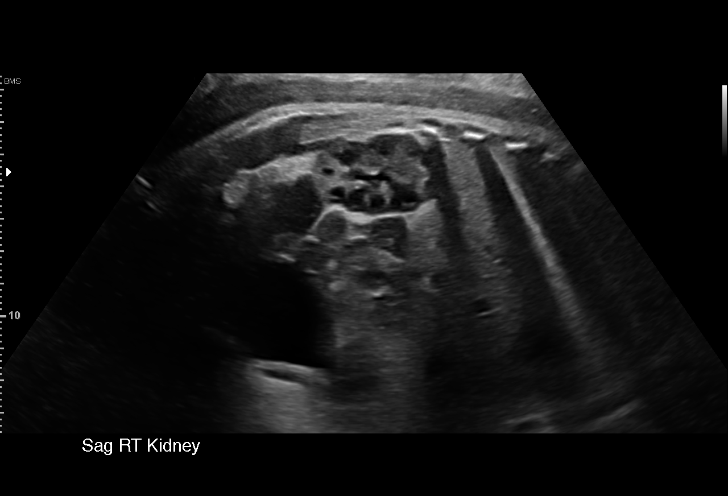
[im 31/37]
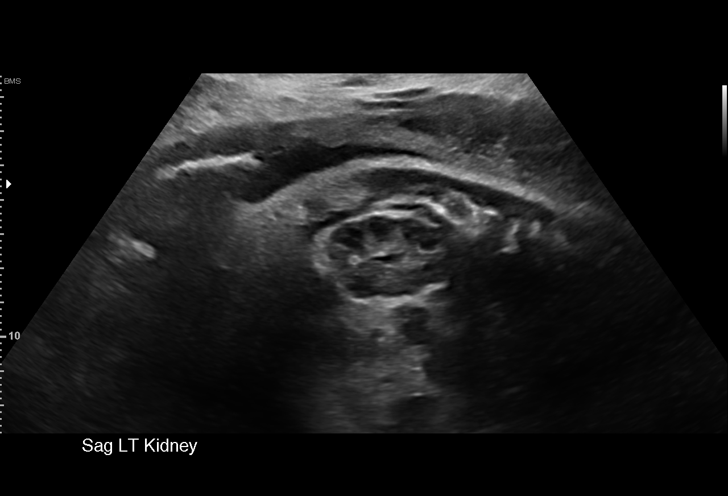
[im 34/37]
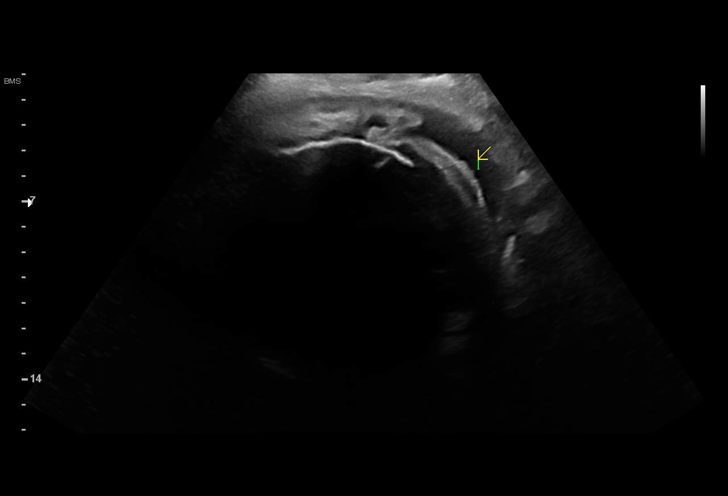
[im 37/37]
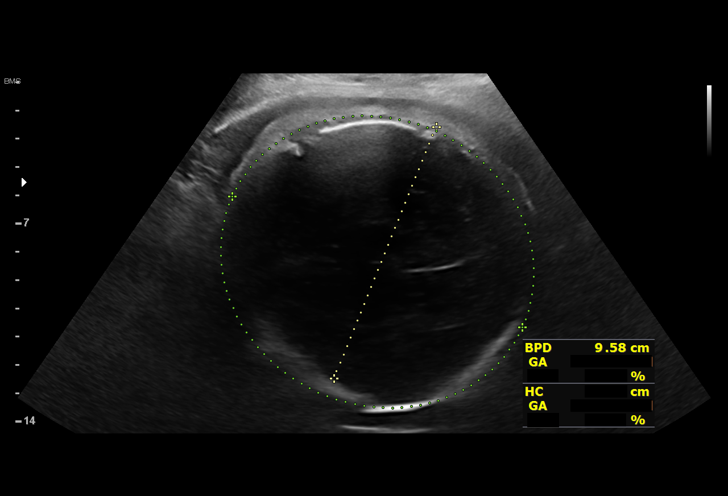

[14 of 28 positions shown; findings below may reference images not displayed]

LABELLE SALHA

Indications

 Vaginal bleeding in pregnancy, third trimester
 Encounter for other antenatal screening
 follow-up
 38 weeks gestation of pregnancy
Vital Signs

                                                Height:        5'4"
Fetal Evaluation

 Num Of Fetuses:         1
 Fetal Heart Rate(bpm):  141
 Cardiac Activity:       Observed
 Presentation:           Cephalic
 Placenta:               Posterior

 Amniotic Fluid
 AFI FV:      Within normal limits

 AFI Sum(cm)     %Tile       Largest Pocket(cm)
 10.34           30

 RUQ(cm)       RLQ(cm)       LUQ(cm)        LLQ(cm)
 4             4.27          0
Biometry
 BPD:        97  mm     G. Age:  39w 5d         92  %    CI:        83.49   %    70 - 86
                                                         FL/HC:      21.2   %    20.6 -
 HC:      334.7  mm     G. Age:  38w 2d         24  %    HC/AC:      0.90        0.87 -
 AC:      370.8  mm     G. Age:  41w 0d         98  %    FL/BPD:     73.2   %    71 - 87
 FL:         71  mm     G. Age:  36w 3d          7  %    FL/AC:      19.1   %    20 - 24

 Est. FW:    6815  gm      8 lb 8 oz     84  %
OB History

 Gravidity:    1
Gestational Age

 LMP:           38w 6d        Date:  09/17/19                 EDD:   06/23/20
 U/S Today:     38w 6d                                        EDD:   06/23/20
 Best:          38w 6d     Det. By:  LMP  (09/17/19)          EDD:   06/23/20
Anatomy

 Cranium:               Previously seen        Aortic Arch:            Previously seen
 Cavum:                 Previously seen        Ductal Arch:            Previously seen
 Ventricles:            Previously seen        Diaphragm:              Appears normal
 Choroid Plexus:        Previously seen        Stomach:                Appears normal, left
                                                                       sided
 Cerebellum:            Previously seen        Abdomen:                Appears normal
 Posterior Fossa:       Previously seen        Abdominal Wall:         Previously seen
 Nuchal Fold:           Previously seen        Cord Vessels:           Previously seen
 Face:                  Orbits and profile     Kidneys:                Appear normal
                        previously seen
 Lips:                  Previously seen        Bladder:                Appears normal
 Thoracic:              Previously seen        Spine:                  Previously seen
 Heart:                 Previously seen        Upper Extremities:      Previously seen
 RVOT:                  Previously seen        Lower Extremities:      Previously seen
 LVOT:                  Previously seen

 Other:  Fetus appears to be a male. Heels and 5th digit prev visualized.
         Nasal bone prev visualized. Open hands visualized. 3VV prev
         visualized. Technically difficult due to fetal position.
Cervix Uterus Adnexa

 Cervix
 Not visualized (advanced GA >99wks)
Comments

 This patient was seen for a follow up growth scan due to
 maternal obesity.  She denies any problems since her last
 exam and reports that she has screened negative for
 gestational diabetes in her current pregnancy.
 She was informed that the fetal growth and amniotic fluid
 level appears appropriate for her gestational age.
 The patient reports that she already has an induction
 scheduled on June 23, 2020.
 No further exams were scheduled in our office.

## 2022-03-20 ENCOUNTER — Ambulatory Visit: Payer: Medicaid Other | Admitting: Family Medicine

## 2022-05-29 ENCOUNTER — Other Ambulatory Visit (HOSPITAL_COMMUNITY)
Admission: RE | Admit: 2022-05-29 | Discharge: 2022-05-29 | Disposition: A | Discharge status: Home / Self Care | Payer: Medicaid Other | Source: Ambulatory Visit | Attending: Family Medicine | Admitting: Family Medicine

## 2022-05-29 ENCOUNTER — Ambulatory Visit (INDEPENDENT_AMBULATORY_CARE_PROVIDER_SITE_OTHER): Payer: Medicaid Other | Admitting: Family Medicine

## 2022-05-29 ENCOUNTER — Ambulatory Visit: Payer: Medicaid Other | Admitting: Family Medicine

## 2022-05-29 ENCOUNTER — Encounter: Payer: Self-pay | Admitting: Family Medicine

## 2022-05-29 VITALS — BP 112/80 | HR 106 | Ht 63.0 in | Wt 185.0 lb

## 2022-05-29 DIAGNOSIS — Z124 Encounter for screening for malignant neoplasm of cervix: Secondary | ICD-10-CM | POA: Diagnosis not present

## 2022-05-29 DIAGNOSIS — Z01419 Encounter for gynecological examination (general) (routine) without abnormal findings: Secondary | ICD-10-CM

## 2022-05-29 NOTE — Progress Notes (Signed)
Subjective:     Elizabeth [name] is a 31 y.o. female and is here for a comprehensive physical exam. The patient reports no problems. On Topamax and Phentermine for weight loss. Down 40#. No longer on Zoloft.   The following portions of the patient's history were reviewed and updated as appropriate: allergies, current medications, past family history, past medical history, past social history, past surgical history, and problem list.  Review of Systems Pertinent items noted in HPI and remainder of comprehensive ROS otherwise negative.   Objective:    BP 112/80   Pulse (!) 106   Ht 5\' 3"  (1.6 m)   Wt 185 lb (83.9 kg)   BMI 32.77 kg/m  General appearance: alert, cooperative, and appears stated age Head: Normocephalic, without obvious abnormality, atraumatic Neck: no adenopathy, supple, symmetrical, trachea midline, and thyroid not enlarged, symmetric, no tenderness/mass/nodules Lungs: clear to auscultation bilaterally Breasts: normal appearance, no masses or tenderness Heart: regular rate and rhythm, S1, S2 normal, no murmur, click, rub or gallop Abdomen: soft, non-tender; bowel sounds normal; no masses,  no organomegaly Pelvic: cervix normal in appearance, external genitalia normal, no adnexal masses or tenderness, no cervical motion tenderness, uterus normal size, shape, and consistency, vagina normal without discharge, and IUD strings felt Extremities: extremities normal, atraumatic, no cyanosis or edema Pulses: 2+ and symmetric Skin: Skin color, texture, turgor normal. No rashes or lesions Lymph nodes: Cervical, supraclavicular, and axillary nodes normal. Neurologic: Grossly normal    Assessment:    Healthy female exam.      Plan:  Screening for malignant neoplasm of cervix - Plan: Cytology - PAP  Encounter for gynecological examination without abnormal finding  Return in 1 year (on 05/30/2023).    See After Visit Summary for Counseling Recommendations

## 2022-05-29 NOTE — Patient Instructions (Signed)

## 2022-06-04 LAB — CYTOLOGY - PAP
Adequacy: ABSENT
Comment: NEGATIVE
Diagnosis: NEGATIVE
High risk HPV: NEGATIVE

## 2023-06-27 ENCOUNTER — Ambulatory Visit: Payer: Managed Care, Other (non HMO) | Admitting: Family Medicine

## 2023-06-27 ENCOUNTER — Encounter: Payer: Self-pay | Admitting: Family Medicine

## 2023-06-27 VITALS — BP 102/78 | HR 86 | Temp 98.0°F | Ht 63.0 in | Wt 199.0 lb

## 2023-06-27 DIAGNOSIS — R519 Headache, unspecified: Secondary | ICD-10-CM | POA: Diagnosis not present

## 2023-06-27 DIAGNOSIS — F5101 Primary insomnia: Secondary | ICD-10-CM | POA: Diagnosis not present

## 2023-06-27 DIAGNOSIS — F411 Generalized anxiety disorder: Secondary | ICD-10-CM

## 2023-06-27 DIAGNOSIS — Z6835 Body mass index (BMI) 35.0-35.9, adult: Secondary | ICD-10-CM

## 2023-06-27 DIAGNOSIS — E049 Nontoxic goiter, unspecified: Secondary | ICD-10-CM

## 2023-06-27 LAB — CBC WITH DIFFERENTIAL/PLATELET
Basophils Absolute: 0 10*3/uL (ref 0.0–0.1)
Basophils Relative: 0.6 % (ref 0.0–3.0)
Eosinophils Absolute: 0.3 10*3/uL (ref 0.0–0.7)
Eosinophils Relative: 3.8 % (ref 0.0–5.0)
HCT: 45 % (ref 36.0–46.0)
Hemoglobin: 15.1 g/dL — ABNORMAL HIGH (ref 12.0–15.0)
Lymphocytes Relative: 19 % (ref 12.0–46.0)
Lymphs Abs: 1.5 10*3/uL (ref 0.7–4.0)
MCHC: 33.5 g/dL (ref 30.0–36.0)
MCV: 95 fL (ref 78.0–100.0)
Monocytes Absolute: 0.5 10*3/uL (ref 0.1–1.0)
Monocytes Relative: 6.4 % (ref 3.0–12.0)
Neutro Abs: 5.5 10*3/uL (ref 1.4–7.7)
Neutrophils Relative %: 70.2 % (ref 43.0–77.0)
Platelets: 234 10*3/uL (ref 150.0–400.0)
RBC: 4.74 Mil/uL (ref 3.87–5.11)
RDW: 12.8 % (ref 11.5–15.5)
WBC: 7.9 10*3/uL (ref 4.0–10.5)

## 2023-06-27 LAB — COMPREHENSIVE METABOLIC PANEL
ALT: 12 U/L (ref 0–35)
AST: 14 U/L (ref 0–37)
Albumin: 4.4 g/dL (ref 3.5–5.2)
Alkaline Phosphatase: 56 U/L (ref 39–117)
BUN: 13 mg/dL (ref 6–23)
CO2: 28 meq/L (ref 19–32)
Calcium: 9.2 mg/dL (ref 8.4–10.5)
Chloride: 104 meq/L (ref 96–112)
Creatinine, Ser: 0.75 mg/dL (ref 0.40–1.20)
GFR: 105.33 mL/min (ref >60.00)
Glucose, Bld: 79 mg/dL (ref 70–99)
Potassium: 4.1 meq/L (ref 3.5–5.1)
Sodium: 140 meq/L (ref 135–145)
Total Bilirubin: 0.6 mg/dL (ref 0.2–1.2)
Total Protein: 7.3 g/dL (ref 6.0–8.3)

## 2023-06-27 LAB — TSH: TSH: 1.2 u[IU]/mL (ref 0.35–5.50)

## 2023-06-27 MED ORDER — ESCITALOPRAM OXALATE 5 MG PO TABS: ORAL_TABLET | ORAL | 0 refills | Status: DC

## 2023-06-27 MED ORDER — BUSPIRONE HCL 5 MG PO TABS: 5.0000 mg | ORAL_TABLET | Freq: Two times a day (BID) | ORAL | 0 refills | Status: AC | PRN

## 2023-06-27 NOTE — Progress Notes (Signed)
New Patient Office Visit  Subjective    Patient ID: Elizabeth [name], female    DOB: 1991/06/07  Age: 32 y.o. MRN: [id number]  CC:  Chief Complaint  Patient presents with   Establish Care    Establish Care. Notes of having anxiety for their whole life, has gotten worse after having their first child. Noted of stress/tension headaches that lasted for 4 days straight, thought it was due to lack of sleep. Also having trouble with speech, notes having trouble getting words out, believes they are over thinking what they are saying    HPI Elizabeth [name] presents to establish care today. Reports that she has been having anxiety since having her first child.  Reports this is interfering with her sleep, having headaches. Also having trouble with word finding with increased anxiety. Reports that she also has history of headaches. Is taking semaglutide for weight loss, she is on 5 mg once a week for the last 2 months.  States she has not noticed a difference with this. States she does not have a lot of help at home with her child. Has not had a formal psych eval or seeing counselor in the past, would like to do so today. Denies SI, HI. She is not fasting today. Medical history as outlined below.    Outpatient Encounter Medications as of 06/27/2023  Medication Sig   busPIRone (BUSPAR) 5 MG tablet Take 1 tablet (5 mg total) by mouth 2 (two) times daily as needed.   escitalopram (LEXAPRO) 5 MG tablet 1/2 tab for the first 7 days, then one tablet once daily   Semaglutide,0.25 or 0.5MG /DOS, 2 MG/1.5ML SOPN Inject 0.5 mg into the skin once a week.   buPROPion (WELLBUTRIN SR) 150 MG 12 hr tablet Take 150 mg by mouth 2 (two) times daily. (Patient not taking: Reported on 06/27/2023)   cetirizine (ZYRTEC) 10 MG chewable tablet Chew 10 mg by mouth daily. (Patient not taking: Reported on 06/27/2023)   cholecalciferol (VITAMIN D3) 25 MCG (1000 UNIT) tablet Take 1,000 Units by mouth daily. (Patient not taking:  Reported on 06/27/2023)   fluticasone (FLONASE) 50 MCG/ACT nasal spray Place into both nostrils daily. (Patient not taking: Reported on 06/27/2023)   levonorgestrel (MIRENA) 20 MCG/24HR IUD 1 each by Intrauterine route once. (Patient not taking: Reported on 06/27/2023)   phentermine (ADIPEX-P) 37.5 MG tablet Take 37.5 mg by mouth daily before breakfast. (Patient not taking: Reported on 06/27/2023)   topiramate (TOPAMAX) 100 MG tablet Take 100 mg by mouth 2 (two) times daily. (Patient not taking: Reported on 06/27/2023)   No facility-administered encounter medications on file as of 06/27/2023.    Past Medical History:  Diagnosis Date   Anxiety     Past Surgical History:  Procedure Laterality Date   CESAREAN SECTION N/A 06/25/2020   Procedure: CESAREAN SECTION;  Surgeon: Lazaro Arms, MD;  Location: [facility];  Service: Obstetrics;  Laterality: N/A;   IUD INSERTION  08/03/2020       WISDOM TOOTH EXTRACTION      Family History  Problem Relation Age of Onset   Cancer Maternal Aunt    Cancer Maternal Grandmother     Social History   Socioeconomic History   Marital status: Single    Spouse name: Not on file   Number of children: Not on file   Years of education: Not on file   Highest education level: 12th grade  Occupational History   Not on file  Tobacco Use  Smoking status: Never   Smokeless tobacco: Never  Vaping Use   Vaping status: Never Used  Substance and Sexual Activity   Alcohol use: Not Currently    Comment: social   Drug use: No   Sexual activity: Yes    Birth control/protection: I.U.D.  Other Topics Concern   Not on file  Social History Narrative   Not on file   Social Drivers of Health   Financial Resource Strain: Low Risk  (06/25/2023)   Overall Financial Resource Strain (CARDIA)    Difficulty of Paying Living Expenses: Not very hard  Food Insecurity: No Food Insecurity (06/25/2023)   Hunger Vital Sign    Worried About Running Out of Food in the  Last Year: Never true    Ran Out of Food in the Last Year: Never true  Transportation Needs: No Transportation Needs (06/25/2023)   PRAPARE - Administrator, Civil Service (Medical): No    Lack of Transportation (Non-Medical): No  Physical Activity: Insufficiently Active (06/25/2023)   Exercise Vital Sign    Days of Exercise per Week: 2 days    Minutes of Exercise per Session: 30 min  Stress: Stress Concern Present (06/25/2023)   [employer] of Occupational Health - Occupational Stress Questionnaire    Feeling of Stress : Very much  Social Connections: Moderately Isolated (06/25/2023)   Social Connection and Isolation Panel [NHANES]    Frequency of Communication with Friends and Family: Three times a week    Frequency of Social Gatherings with Friends and Family: Once a week    Attends Religious Services: Never    Database administrator or Organizations: No    Attends Engineer, structural: Not on file    Marital Status: Living with partner  Intimate Partner Violence: Not on file    ROS Per HPI      Objective    BP 102/78 (BP Location: Left Arm, Patient Position: Sitting)   Pulse 86   Temp 98 F (36.7 C) (Oral)   Ht 5\' 3"  (1.6 m)   Wt 199 lb (90.3 kg)   SpO2 98%   BMI 35.25 kg/m   Physical Exam Vitals and nursing note reviewed.  Constitutional:      Appearance: Normal appearance. She is normal weight.  HENT:     Head: Normocephalic and atraumatic.     Right Ear: Tympanic membrane and ear canal normal.     Left Ear: Tympanic membrane and ear canal normal.     Nose: Nose normal.  Eyes:     Extraocular Movements: Extraocular movements intact.     Pupils: Pupils are equal, round, and reactive to light.  Neck:     Thyroid: Thyromegaly (R>L, non tender, non nodular) present.  Cardiovascular:     Rate and Rhythm: Normal rate and regular rhythm.     Heart sounds: Normal heart sounds.  Pulmonary:     Effort: Pulmonary effort is normal.      Breath sounds: Normal breath sounds.  Musculoskeletal:        General: Normal range of motion.     Cervical back: Normal range of motion.  Neurological:     General: No focal deficit present.     Mental Status: She is alert and oriented to person, place, and time.  Psychiatric:        Mood and Affect: Mood normal.        Thought Content: Thought content normal.  Assessment & Plan:   GAD (generalized anxiety disorder) -     Ambulatory referral to Psychology -     Escitalopram Oxalate; 1/2 tab for the first 7 days, then one tablet once daily  Dispense: 30 tablet; Refill: 0 -     busPIRone HCl; Take 1 tablet (5 mg total) by mouth 2 (two) times daily as needed.  Dispense: 60 tablet; Refill: 0 -     CBC with Differential/Platelet -     Comprehensive metabolic panel  Obesity, morbid (HCC) -     CBC with Differential/Platelet -     Comprehensive metabolic panel -     TSH  Primary insomnia -     CBC with Differential/Platelet -     Comprehensive metabolic panel -     TSH  Generalized headaches -     CBC with Differential/Platelet -     Comprehensive metabolic panel  Enlarged thyroid -     US THYROID; Future  BMI 35.0-35.9,adult   - Continue semaglutide - Discussed healthy diet and activity level  Return in about 6 weeks (around 08/08/2023) for recheck meds.   Moshe Cipro, FNP

## 2023-06-27 NOTE — Patient Instructions (Addendum)
I would try miralax once daily in combination with the magnesium.  I have sent in lexapro for you to take 1/2 tab once a day for 7 days, then one tablet daily afterward.  I have sent in buspirone for you to take one tablet twice a day as needed for anxiety.  We are checking labs today, will be in contact with any results that require further attention  We have placed a referral for you for our counselor today. Someone will be reaching out to get you scheduled.   Please follow up with me in 6 weeks for medication management.

## 2023-07-03 ENCOUNTER — Ambulatory Visit
Admission: RE | Admit: 2023-07-03 | Discharge: 2023-07-03 | Disposition: A | Discharge status: Home / Self Care | Payer: Managed Care, Other (non HMO) | Source: Ambulatory Visit | Attending: Family Medicine | Admitting: Family Medicine

## 2023-07-03 DIAGNOSIS — E049 Nontoxic goiter, unspecified: Secondary | ICD-10-CM

## 2023-07-10 ENCOUNTER — Encounter: Payer: Self-pay | Admitting: Family Medicine

## 2023-07-15 ENCOUNTER — Ambulatory Visit: Payer: 59 | Admitting: Licensed Clinical Social Worker

## 2023-07-22 ENCOUNTER — Encounter: Payer: Self-pay | Admitting: Family Medicine

## 2023-07-28 ENCOUNTER — Other Ambulatory Visit: Payer: Self-pay | Admitting: Family Medicine

## 2023-07-28 DIAGNOSIS — F411 Generalized anxiety disorder: Secondary | ICD-10-CM

## 2023-07-29 MED ORDER — ESCITALOPRAM OXALATE 5 MG PO TABS: ORAL_TABLET | ORAL | 0 refills | Status: DC

## 2023-07-30 ENCOUNTER — Ambulatory Visit: Payer: 59 | Admitting: Licensed Clinical Social Worker

## 2023-08-08 ENCOUNTER — Ambulatory Visit: Payer: Managed Care, Other (non HMO) | Admitting: Family Medicine

## 2023-08-12 ENCOUNTER — Ambulatory Visit: Payer: 59 | Admitting: Licensed Clinical Social Worker

## 2023-08-12 DIAGNOSIS — F419 Anxiety disorder, unspecified: Secondary | ICD-10-CM | POA: Diagnosis not present

## 2023-08-12 NOTE — Progress Notes (Signed)
 [facility] Counselor Initial Adult Exam  Name: Elizabeth [name] Date: 08/12/2023 MRN: [id number] DOB: October 13, 1990 PCP: Alvia Krabbe, FNP  Time Spent: 9:03  am - 9:57    am : 54 Minutes  Guardian/Payee:  Self/Adult    Paperwork requested: No   Reason for Visit /Presenting Problem: Not finding time for herself, mom life and work, tired and burnt out with day to day activities  Mental Status Exam: Appearance:   Neat     Behavior:  Appropriate and Sharing  Motor:  Normal  Speech/Language:   Garbled  Affect:  Appropriate and Flat  Mood:  normal  Thought process:  goal directed  Thought content:    WNL  Sensory/Perceptual disturbances:    WNL  Orientation:  oriented to person, place, and time/date  Attention:  Good  Concentration:  Good  Memory:  WNL  Fund of knowledge:   Good  Insight:    Good  Judgment:   Good  Impulse Control:  Good   Reported Symptoms:  shy, feeling overwhelmed and not supported by fiance, grew up sheltered, afraid to ask for what she needs, over thinking, low self esteem, doesn't feel like she fits in  Risk Assessment: Danger to Self:  No Self-injurious Behavior: No Danger to Others: No Duty to Warn:no Physical Aggression / Violence:No  Access to Firearms a concern: No  Gang Involvement:No  Patient / guardian was educated about steps to take if suicide or homicide risk level increases between visits: yes While future psychiatric events cannot be accurately predicted, the patient does not currently require acute inpatient psychiatric care and does not currently meet [place]  involuntary commitment criteria.  Substance Abuse History: Current substance abuse: No     Caffeine : Tobacco: Alcohol: Substance use:  Past Psychiatric History:   No previous psychological problems have been observed Outpatient Providers: 3 years go after birth of son PP History of Psych Hospitalization: No  Psychological Testing:  No    Abuse  History:  Victim of: Yes, emotional   Report needed: No. Victim of Neglect:No. Perpetrator of  No   Witness / Exposure to Domestic Violence: No   Protective Services Involvement: No  Witness to [employer] Violence:  No   Family History:  Family History  Problem Relation Age of Onset   Cancer Maternal Aunt    Cancer Maternal Grandmother     Living situation: the patient lives with their partner  Sexual Orientation: Straight  Relationship Status: co-habitating  Name of spouse / other:Joe If a parent, number of children / ages:Son 68 years old  Support Systems: parents  Surveyor, Quantity Stress:  Yes   Income/Employment/Disability: Employment  Financial Planner: No   Educational History: Education: high school diploma/GED  Religion/Sprituality/World View: None  Any cultural differences that may affect / interfere with treatment:  None  Recreation/Hobbies: Not sure anymore since she has been focused on son and self esteem  Stressors: Financial difficulties   Marital or family conflict    Strengths: Supportive Relationships  Barriers:  None   Legal History: Pending legal issue / charges: The patient has no significant history of legal issues. History of legal issue / charges:  None  Medical History/Surgical History: not reviewed Past Medical History:  Diagnosis Date   Anxiety     Past Surgical History:  Procedure Laterality Date   CESAREAN SECTION N/A 06/25/2020   Procedure: CESAREAN SECTION;  Surgeon: Jayne Vonn DEL, MD;  Location: [facility] LD [facility]S;  Service: Obstetrics;  Laterality: N/A;  IUD INSERTION  08/03/2020       WISDOM TOOTH EXTRACTION      Medications: Current Outpatient Medications  Medication Sig Dispense Refill   busPIRone  (BUSPAR ) 5 MG tablet Take 1 tablet (5 mg total) by mouth 2 (two) times daily as needed. 60 tablet 0   cetirizine  (ZYRTEC ) 10 MG chewable tablet Chew 10 mg by mouth daily. (Patient not taking: Reported on 06/27/2023)      cholecalciferol (VITAMIN D3) 25 MCG (1000 UNIT) tablet Take 1,000 Units by mouth daily. (Patient not taking: Reported on 06/27/2023)     escitalopram  (LEXAPRO ) 5 MG tablet 1/2 tab for the first 7 days, then one tablet once daily 30 tablet 0   levonorgestrel  (MIRENA ) 20 MCG/24HR IUD 1 each by Intrauterine route once. (Patient not taking: Reported on 06/27/2023)     Semaglutide,0.25 or 0.5MG /DOS, 2 MG/1.5ML SOPN Inject 0.5 mg into the skin once a week.     No current facility-administered medications for this visit.    No Known Allergies  Diagnoses:  Anxiety  Psychiatric Treatment: No , None  Plan of Care: Patient is extremely fearful, not wanting to stay in current relationship but also shared she is not ready to take action to make necessary changes at this time.     Narrative:  Elizabeth [name] participated from office with therapist and consented to treatment. We reviewed the limits of confidentiality prior to the start of the evaluation. Elizabeth [name] expressed understanding and agreement to proceed.     A follow-up was scheduled to create a treatment plan and begin treatment. Therapist answered  and all questions during the evaluation and contact information was provided.    Tawni Louder, LCMHC

## 2023-08-15 ENCOUNTER — Encounter: Payer: Self-pay | Admitting: Family Medicine

## 2023-08-15 ENCOUNTER — Ambulatory Visit (INDEPENDENT_AMBULATORY_CARE_PROVIDER_SITE_OTHER): Payer: Managed Care, Other (non HMO) | Admitting: Family Medicine

## 2023-08-15 VITALS — BP 90/76 | HR 82 | Temp 98.4°F | Ht 63.0 in | Wt 191.4 lb

## 2023-08-15 DIAGNOSIS — F53 Postpartum depression: Secondary | ICD-10-CM

## 2023-08-15 DIAGNOSIS — E559 Vitamin D deficiency, unspecified: Secondary | ICD-10-CM

## 2023-08-15 DIAGNOSIS — J302 Other seasonal allergic rhinitis: Secondary | ICD-10-CM

## 2023-08-15 DIAGNOSIS — E789 Disorder of lipoprotein metabolism, unspecified: Secondary | ICD-10-CM

## 2023-08-15 DIAGNOSIS — G4452 New daily persistent headache (NDPH): Secondary | ICD-10-CM | POA: Diagnosis not present

## 2023-08-15 DIAGNOSIS — D582 Other hemoglobinopathies: Secondary | ICD-10-CM | POA: Diagnosis not present

## 2023-08-15 DIAGNOSIS — F419 Anxiety disorder, unspecified: Secondary | ICD-10-CM | POA: Diagnosis not present

## 2023-08-15 LAB — LIPID PANEL
Cholesterol: 164 mg/dL (ref 0–200)
HDL: 52.3 mg/dL (ref >39.00)
LDL Cholesterol: 100 mg/dL — ABNORMAL HIGH (ref 0–99)
NonHDL: 111.41
Total CHOL/HDL Ratio: 3
Triglycerides: 56 mg/dL (ref 0.0–149.0)
VLDL: 11.2 mg/dL (ref 0.0–40.0)

## 2023-08-15 LAB — CBC WITH DIFFERENTIAL/PLATELET
Basophils Absolute: 0 10*3/uL (ref 0.0–0.1)
Basophils Relative: 0.6 % (ref 0.0–3.0)
Eosinophils Absolute: 0.4 10*3/uL (ref 0.0–0.7)
Eosinophils Relative: 5.9 % — ABNORMAL HIGH (ref 0.0–5.0)
HCT: 43.4 % (ref 36.0–46.0)
Hemoglobin: 14.6 g/dL (ref 12.0–15.0)
Lymphocytes Relative: 24.2 % (ref 12.0–46.0)
Lymphs Abs: 1.7 10*3/uL (ref 0.7–4.0)
MCHC: 33.7 g/dL (ref 30.0–36.0)
MCV: 93.9 fL (ref 78.0–100.0)
Monocytes Absolute: 0.4 10*3/uL (ref 0.1–1.0)
Monocytes Relative: 6.3 % (ref 3.0–12.0)
Neutro Abs: 4.3 10*3/uL (ref 1.4–7.7)
Neutrophils Relative %: 63 % (ref 43.0–77.0)
Platelets: 235 10*3/uL (ref 150.0–400.0)
RBC: 4.62 Mil/uL (ref 3.87–5.11)
RDW: 13.1 % (ref 11.5–15.5)
WBC: 6.8 10*3/uL (ref 4.0–10.5)

## 2023-08-15 LAB — VITAMIN D 25 HYDROXY (VIT D DEFICIENCY, FRACTURES): VITD: 53.84 ng/mL (ref 30.00–100.00)

## 2023-08-15 LAB — MAGNESIUM: Magnesium: 2.2 mg/dL (ref 1.5–2.5)

## 2023-08-15 MED ORDER — ESCITALOPRAM OXALATE 10 MG PO TABS: 10.0000 mg | ORAL_TABLET | Freq: Every day | ORAL | 1 refills | Status: DC

## 2023-08-15 MED ORDER — CETIRIZINE HCL 10 MG PO CHEW: 10.0000 mg | CHEWABLE_TABLET | Freq: Every day | ORAL | 1 refills | Status: DC

## 2023-08-15 MED ORDER — FLUTICASONE PROPIONATE 50 MCG/ACT NA SUSP: 1.0000 | NASAL | 1 refills | Status: DC | PRN

## 2023-08-15 NOTE — Patient Instructions (Signed)
 I have increased your lexapro  to 10mg  once daily.  I have refilled flonase  and zyrtec  for you today.  We are checking labs today, will be in contact with any results that require further attention  I have ordered a CT of  your head at [facility] to eval for cause of your headaches.

## 2023-08-15 NOTE — Progress Notes (Signed)
 Established Patient Office Visit  Subjective   Patient ID: Elizabeth [name], female    DOB: 1991-02-27  Age: 33 y.o. MRN: [id number]  Chief Complaint  Patient presents with   Anxiety    6 week follow up on anxiety meds and the patient states that she has been doing pretty good on them. She also states that she notices a little bit of a change but she is still having headaches. She had a headache yesterday and feels that she's getting them everyday. The pain isn't really bad but its an annoyance and she feels that she can't concentrate when she has them.     HPI Patient presents today for medication management. Reports compliance with medication regimen. Not fasting today. Reports that she is still having headaches daily.  This has been going on for more than a month.  Has been having to take ibuprofen  3-4 times a day to help with headaches.  Denies any vision changes, dizziness, nausea, vomiting, falls, known head injury. She is now also seeing Tawni Louder, counselor with [facility]. Reports that this is going well. Requesting to refill flonase  and zyrtec  today. Denies other concerns. Medical history as outlined below.  ROS Per HPI    Objective:     BP 90/76 (BP Location: Left Arm, Patient Position: Sitting, Cuff Size: Normal)   Pulse 82   Temp 98.4 F (36.9 C) (Oral)   Ht 5' 3 (1.6 m)   Wt 191 lb 6.4 oz (86.8 kg)   SpO2 98%   BMI 33.90 kg/m   Physical Exam Vitals and nursing note reviewed.  Constitutional:      General: She is not in acute distress.    Appearance: Normal appearance. She is obese.  HENT:     Head: Normocephalic and atraumatic.     Nose: Nose normal.  Eyes:     Extraocular Movements: Extraocular movements intact.     Pupils: Pupils are equal, round, and reactive to light.  Cardiovascular:     Rate and Rhythm: Normal rate and regular rhythm.     Heart sounds: Normal heart sounds.  Pulmonary:     Effort: Pulmonary effort is normal. No  respiratory distress.     Breath sounds: Normal breath sounds. No wheezing, rhonchi or rales.  Musculoskeletal:        General: Normal range of motion.     Cervical back: Normal range of motion.  Lymphadenopathy:     Cervical: No cervical adenopathy.  Neurological:     General: No focal deficit present.     Mental Status: She is alert and oriented to person, place, and time.  Psychiatric:        Mood and Affect: Mood normal.        Thought Content: Thought content normal.     No results found for any visits on 08/15/23.   The ASCVD Risk score (Arnett DK, et al., 2019) failed to calculate for the following reasons:   The 2019 ASCVD risk score is only valid for ages 53 to 78    Assessment & Plan:   Vitamin D  deficiency -     VITAMIN D  25 Hydroxy (Vit-D Deficiency, Fractures)  Postpartum depression -     Magnesium -     Escitalopram  Oxalate; Take 1 tablet (10 mg total) by mouth daily.  Dispense: 90 tablet; Refill: 1  Anxiety -     VITAMIN D  25 Hydroxy (Vit-D Deficiency, Fractures) -     Magnesium -  Escitalopram  Oxalate; Take 1 tablet (10 mg total) by mouth daily.  Dispense: 90 tablet; Refill: 1  Obesity, morbid (HCC)  - Continue semaglutide - Continue efforts in healthy diet and activity level  Seasonal allergies -     Fluticasone  Propionate; Place 1 spray into both nostrils as needed for allergies.  Dispense: 16 mL; Refill: 1 -     Cetirizine  HCl; Chew 1 tablet (10 mg total) by mouth daily.  Dispense: 90 tablet; Refill: 1  New daily persistent headache -     VITAMIN D  25 Hydroxy (Vit-D Deficiency, Fractures) -     Magnesium -     CT HEAD WO CONTRAST ( ); Future  Abnormal cholesterol test -     Lipid panel  Elevated hemoglobin (HCC) -     CBC with Differential/Platelet   Return in about 6 months (around 02/12/2024) for med check.    Corean Ku, FNP

## 2023-08-18 ENCOUNTER — Other Ambulatory Visit: Payer: Managed Care, Other (non HMO)

## 2023-08-22 ENCOUNTER — Ambulatory Visit (HOSPITAL_BASED_OUTPATIENT_CLINIC_OR_DEPARTMENT_OTHER): Payer: Managed Care, Other (non HMO)

## 2023-08-26 ENCOUNTER — Ambulatory Visit
Admission: RE | Admit: 2023-08-26 | Discharge: 2023-08-26 | Disposition: A | Discharge status: Home / Self Care | Payer: Managed Care, Other (non HMO) | Source: Ambulatory Visit | Attending: Family Medicine | Admitting: Family Medicine

## 2023-08-26 DIAGNOSIS — G4452 New daily persistent headache (NDPH): Secondary | ICD-10-CM

## 2023-08-27 ENCOUNTER — Other Ambulatory Visit: Payer: Managed Care, Other (non HMO)

## 2023-09-01 ENCOUNTER — Encounter: Payer: Self-pay | Admitting: Family Medicine

## 2023-09-01 DIAGNOSIS — K5909 Other constipation: Secondary | ICD-10-CM

## 2023-09-01 MED ORDER — LINACLOTIDE 145 MCG PO CAPS: 145.0000 ug | ORAL_CAPSULE | Freq: Every day | ORAL | 1 refills | Status: DC

## 2023-09-02 ENCOUNTER — Encounter: Payer: Self-pay | Admitting: Family Medicine

## 2023-09-02 NOTE — Addendum Note (Signed)
 Addended by: Sherald Barge on: 09/02/2023 12:55 PM   Modules accepted: Orders

## 2023-09-15 ENCOUNTER — Encounter: Payer: Self-pay | Admitting: Family Medicine

## 2023-09-15 DIAGNOSIS — E6609 Other obesity due to excess calories: Secondary | ICD-10-CM

## 2023-09-15 MED ORDER — PHENTERMINE HCL 37.5 MG PO TABS: 37.5000 mg | ORAL_TABLET | Freq: Every day | ORAL | 2 refills | Status: DC

## 2023-09-15 MED ORDER — TOPIRAMATE 50 MG PO TABS: 50.0000 mg | ORAL_TABLET | Freq: Two times a day (BID) | ORAL | 2 refills | Status: DC

## 2023-09-24 ENCOUNTER — Ambulatory Visit (HOSPITAL_BASED_OUTPATIENT_CLINIC_OR_DEPARTMENT_OTHER)

## 2023-09-26 ENCOUNTER — Encounter: Payer: Self-pay | Admitting: Family Medicine

## 2023-09-26 ENCOUNTER — Ambulatory Visit: Admitting: Family Medicine

## 2023-09-26 VITALS — BP 120/90 | HR 116 | Temp 98.5°F | Ht 63.0 in

## 2023-09-26 DIAGNOSIS — R300 Dysuria: Secondary | ICD-10-CM | POA: Diagnosis not present

## 2023-09-26 DIAGNOSIS — N3000 Acute cystitis without hematuria: Secondary | ICD-10-CM

## 2023-09-26 LAB — POCT URINALYSIS DIPSTICK
Bilirubin, UA: NEGATIVE
Blood, UA: NEGATIVE
Glucose, UA: NEGATIVE
Ketones, UA: NEGATIVE
Nitrite, UA: POSITIVE — AB
Protein, UA: POSITIVE — AB
Spec Grav, UA: 1.025 (ref 1.010–1.025)
Urobilinogen, UA: 0.2 U/dL
pH, UA: 6 (ref 5.0–8.0)

## 2023-09-26 MED ORDER — NITROFURANTOIN MONOHYD MACRO 100 MG PO CAPS: 100.0000 mg | ORAL_CAPSULE | Freq: Two times a day (BID) | ORAL | 0 refills | Status: AC

## 2023-09-26 NOTE — Patient Instructions (Signed)
 Have sent in Macrobid for you to take twice a day for 5 days.  Your urine was positive for nitrites and white blood cells, this is typically indicative of a urinary tract infection.  We have sent your urine to the lab for culture for confirmation.  We will be in contact with any results that require further attention  Follow-up with me for new or worsening symptoms.

## 2023-09-26 NOTE — Progress Notes (Signed)
   Acute Office Visit  Subjective:     Patient ID: Elizabeth [name], female    DOB: 04-12-1991, 33 y.o.   MRN: [id number]  Chief Complaint  Patient presents with   Acute Visit    Frequent urination ongoing for about a week, went to UC and was told urine was clean    HPI Patient is in today for urinary frequency for the last week.  Has taken Azo with little relief. Was seen last week at urgent care and [place] and was told that her urine was clean. Per patient, they did send off a culture but she has not heard anything about it. Reports suprapubic pain, cramping sensation, low back pain. Denies chills, fever, nausea, vomiting, diarrhea, rash, other symptoms. Medical history as outlined below.   ROS Per HPI      Objective:    BP (!) 120/90 (BP Location: Left Arm, Patient Position: Sitting)   Pulse (!) 116   Temp 98.5 F (36.9 C) (Temporal)   Ht 5\' 3"  (1.6 m)   SpO2 99%   Breastfeeding No   BMI 33.90 kg/m    Physical Exam Vitals and nursing note reviewed.  Constitutional:      General: She is not in acute distress.    Appearance: Normal appearance. She is normal weight.  HENT:     Head: Normocephalic and atraumatic.  Eyes:     Extraocular Movements: Extraocular movements intact.  Pulmonary:     Effort: Pulmonary effort is normal.  Abdominal:     General: There is no distension.     Palpations: Abdomen is soft. There is no mass.     Tenderness: There is abdominal tenderness (Suprapubic). There is no right CVA tenderness or left CVA tenderness.  Musculoskeletal:     Cervical back: Normal range of motion.  Skin:    General: Skin is warm and dry.  Neurological:     General: No focal deficit present.     Mental Status: She is alert.  Psychiatric:        Mood and Affect: Mood normal.        Behavior: Behavior normal.    No results found for any visits on 09/26/23.      Assessment & Plan:   Acute cystitis without hematuria -     Nitrofurantoin Monohyd  Macro; Take 1 capsule (100 mg total) by mouth 2 (two) times daily for 5 days.  Dispense: 10 capsule; Refill: 0  Dysuria -     POCT urinalysis dipstick -     Urine Culture; Future  Nitrite positive, will treat with Macrobid  Meds ordered this encounter  Medications   nitrofurantoin, macrocrystal-monohydrate, (MACROBID) 100 MG capsule    Sig: Take 1 capsule (100 mg total) by mouth 2 (two) times daily for 5 days.    Dispense:  10 capsule    Refill:  0    Return if symptoms worsen or fail to improve.  Moshe Cipro, FNP

## 2023-09-27 LAB — URINE CULTURE

## 2023-09-29 ENCOUNTER — Encounter: Payer: Self-pay | Admitting: Family Medicine

## 2023-09-29 ENCOUNTER — Other Ambulatory Visit: Payer: Self-pay | Admitting: Family Medicine

## 2023-09-29 DIAGNOSIS — R350 Frequency of micturition: Secondary | ICD-10-CM

## 2023-09-29 DIAGNOSIS — R109 Unspecified abdominal pain: Secondary | ICD-10-CM

## 2023-09-29 MED ORDER — TAMSULOSIN HCL 0.4 MG PO CAPS: 0.4000 mg | ORAL_CAPSULE | Freq: Every day | ORAL | 3 refills | Status: DC

## 2023-09-29 MED ORDER — CYCLOBENZAPRINE HCL 5 MG PO TABS
5.0000 mg | ORAL_TABLET | Freq: Two times a day (BID) | ORAL | 1 refills | Status: AC | PRN
Start: 2023-09-29 — End: ?

## 2023-09-30 ENCOUNTER — Encounter: Payer: Self-pay | Admitting: Family Medicine

## 2023-09-30 ENCOUNTER — Ambulatory Visit
Admission: RE | Admit: 2023-09-30 | Discharge: 2023-09-30 | Disposition: A | Discharge status: Home / Self Care | Source: Ambulatory Visit | Attending: Family Medicine | Admitting: Family Medicine

## 2023-09-30 DIAGNOSIS — R102 Pelvic and perineal pain: Secondary | ICD-10-CM

## 2023-09-30 DIAGNOSIS — R1030 Lower abdominal pain, unspecified: Secondary | ICD-10-CM

## 2023-09-30 DIAGNOSIS — R350 Frequency of micturition: Secondary | ICD-10-CM

## 2023-09-30 DIAGNOSIS — R109 Unspecified abdominal pain: Secondary | ICD-10-CM

## 2023-10-01 ENCOUNTER — Encounter: Payer: Self-pay | Admitting: Family Medicine

## 2023-10-01 ENCOUNTER — Ambulatory Visit: Admitting: Obstetrics & Gynecology

## 2023-10-01 VITALS — BP 121/84 | HR 93 | Wt 184.0 lb

## 2023-10-01 DIAGNOSIS — Z30432 Encounter for removal of intrauterine contraceptive device: Secondary | ICD-10-CM

## 2023-10-01 DIAGNOSIS — T8332XA Displacement of intrauterine contraceptive device, initial encounter: Secondary | ICD-10-CM

## 2023-10-01 NOTE — Progress Notes (Signed)
    GYNECOLOGY OFFICE PROCEDURE NOTE  Elizabeth [name] is a 33 y.o. G1P1001 here for Mirena IUD removal, placed 08/03/2020. Incidentally noted to be malpositioned on recent imaging, patient now has pain for the last 2 days and wants it removed.  No GYN concerns.  Last pap smear was on 05/29/2022 and was normal.  CT RENAL STONE STUDY Result Date: 09/30/2023 CLINICAL DATA:  Flank pain and dysuria for the past 2 weeks. EXAM: CT ABDOMEN AND PELVIS WITHOUT CONTRAST TECHNIQUE: Multidetector CT imaging of the abdomen and pelvis was performed following the standard protocol without IV contrast. RADIATION DOSE REDUCTION: This exam was performed according to the departmental dose-optimization program which includes automated exposure control, adjustment of the mA and/or kV according to patient size and/or use of iterative reconstruction technique. COMPARISON:  CT abdomen pelvis dated June 28, 2012. FINDINGS: Lower chest: No acute abnormality. Hepatobiliary: No focal liver abnormality is seen. No gallstones, gallbladder wall thickening, or biliary dilatation. Pancreas: Unremarkable. No pancreatic ductal dilatation or surrounding inflammatory changes. Spleen: Normal in size without focal abnormality. Adrenals/Urinary Tract: Adrenal glands are unremarkable. Kidneys are normal, without renal calculi, focal lesion, or hydronephrosis. Bladder is decompressed. Stomach/Bowel: Stomach is within normal limits. Appendix appears normal. No evidence of bowel wall thickening, distention, or inflammatory changes. Vascular/Lymphatic: No significant vascular findings are present. No enlarged abdominal or pelvic lymph nodes. Reproductive: Obliquely angled IUD position a within the uterus with the arms imbedded in the myometrium. No adnexal mass. Other: No abdominal wall hernia or abnormality. No abdominopelvic ascites. No pneumoperitoneum. Musculoskeletal: No acute or significant osseous findings. IMPRESSION: 1. No acute intra-abdominal  process. No urolithiasis. 2. Malpositioned IUD with the arms imbedded in the myometrium. Electronically Signed   By: Obie Dredge M.D.   On: 09/30/2023 15:50    IUD Removal  Patient identified, informed consent performed, consent signed.   Discussed that her pain may not be caused by this IUD; this has likely been like this for several years. Explained that she does not have to remove this, but she wants this removed.  Chaperone present.  Patient was placed in the dorsal lithotomy position, normal external genitalia was noted.  A speculum was placed in the patient's vagina, normal discharge was noted, no lesions. The cervix was visualized, no lesions, no abnormal discharge.  The strings of the IUD were grasped and pulled using ring forceps. The IUD was easily removed in its entirety and showed to patient.  Patient tolerated the procedure well.    Patient will use condoms for contraception.  Routine preventative health maintenance measures emphasized.   Jaynie Collins, MD, FACOG Obstetrician & Gynecologist, [facility] for [employer], [facility]

## 2023-10-06 ENCOUNTER — Encounter: Payer: Self-pay | Admitting: Family Medicine

## 2023-10-08 ENCOUNTER — Ambulatory Visit: Admitting: Family Medicine

## 2023-10-08 NOTE — Progress Notes (Deleted)
   Acute Office Visit  Subjective:     Patient ID: Elizabeth [name], female    DOB: 1991-04-09, 33 y.o.   MRN: [id number]  No chief complaint on file.   HPI Patient is in today for evaluation of insomnia, worsening anxiety and depression.  At last visit, she was having pelvic pain, IUD was found to be embedded in her uterus.  IUD removed on *** Has been having the above symptoms since then  ROS Per HPI      Objective:    There were no vitals taken for this visit.   Physical Exam Vitals and nursing note reviewed.  Constitutional:      General: She is not in acute distress.    Appearance: Normal appearance. She is normal weight.  HENT:     Head: Normocephalic and atraumatic.     Right Ear: External ear normal.     Left Ear: External ear normal.     Nose: Nose normal.     Mouth/Throat:     Mouth: Mucous membranes are moist.     Pharynx: Oropharynx is clear.  Eyes:     Extraocular Movements: Extraocular movements intact.     Pupils: Pupils are equal, round, and reactive to light.  Cardiovascular:     Rate and Rhythm: Normal rate and regular rhythm.     Pulses: Normal pulses.     Heart sounds: Normal heart sounds.  Pulmonary:     Effort: Pulmonary effort is normal. No respiratory distress.     Breath sounds: Normal breath sounds. No wheezing, rhonchi or rales.  Musculoskeletal:        General: Normal range of motion.     Cervical back: Normal range of motion.     Right lower leg: No edema.     Left lower leg: No edema.  Lymphadenopathy:     Cervical: No cervical adenopathy.  Neurological:     General: No focal deficit present.     Mental Status: She is alert and oriented to person, place, and time.  Psychiatric:        Mood and Affect: Mood normal.        Thought Content: Thought content normal.   No results found for any visits on 10/08/23.      Assessment & Plan:   Postpartum depression  Anxiety  Other insomnia     No orders of the defined types were  placed in this encounter.   No follow-ups on file.  Sherald Barge, FNP

## 2023-10-08 NOTE — Progress Notes (Unsigned)
 Virtual Visit via Video Note  I connected with Elizabeth [name] on 10/09/23 at 11:00 AM EDT by a video enabled telemedicine application and verified that I am speaking with the correct person using two identifiers.  Patient Location: Home Provider Location: Office/Clinic  I discussed the limitations, risks, security, and privacy concerns of performing an evaluation and management service by video and the availability of in person appointments. I also discussed with the patient that there may be a patient responsible charge related to this service. The patient expressed understanding and agreed to proceed.  Subjective: PCP: Sherald Barge, FNP  Chief Complaint  Patient presents with   Insomnia   Fatigue    Started about for about a month, unsure if it is coming from her Anxiety medications. Recently got her IUD out thinks that may be playing a part as well   HPI 33 year old female presents for A/V visit to discuss medications. At her last visit, she was having pelvic pain and was found to have IUD embedded in the uterus. IUD was removed on 10/01/23. States pain is improved. Saw urology yesterday, is trialing Gemtesa for the next month for overactive bladder. Reports that she is not sleeping, this was going on already, but has worsened since she had the IUD out.  States she feels like her hormones are "everywhere." Has been having symptoms of fatigue, worsening depression since then.  Feels like depression is related to fatigue and just not having the energy to do much and just did not feel like herself. Denies SI, HI  ROS: Per HPI  Current Outpatient Medications:    busPIRone (BUSPAR) 5 MG tablet, Take 1 tablet (5 mg total) by mouth 2 (two) times daily as needed., Disp: 60 tablet, Rfl: 0   cetirizine (ZYRTEC) 10 MG chewable tablet, Chew 1 tablet (10 mg total) by mouth daily., Disp: 90 tablet, Rfl: 1   cholecalciferol (VITAMIN D3) 25 MCG (1000 UNIT) tablet, Take 1,000 Units by  mouth daily., Disp: , Rfl:    cyclobenzaprine (FLEXERIL) 5 MG tablet, Take 1 tablet (5 mg total) by mouth 2 (two) times daily as needed for muscle spasms., Disp: 30 tablet, Rfl: 1   escitalopram (LEXAPRO) 10 MG tablet, Take 1 tablet (10 mg total) by mouth daily., Disp: 90 tablet, Rfl: 1   fluconazole (DIFLUCAN) 100 MG tablet, Take 100 mg by mouth daily., Disp: , Rfl:    fluticasone (FLONASE) 50 MCG/ACT nasal spray, Place 1 spray into both nostrils as needed for allergies., Disp: 16 mL, Rfl: 1   linaclotide (LINZESS) 145 MCG CAPS capsule, Take 1 capsule (145 mcg total) by mouth daily before breakfast., Disp: 30 capsule, Rfl: 1   phentermine (ADIPEX-P) 37.5 MG tablet, Take 1 tablet (37.5 mg total) by mouth daily before breakfast., Disp: 30 tablet, Rfl: 2   topiramate (TOPAMAX) 50 MG tablet, Take 1 tablet (50 mg total) by mouth 2 (two) times daily. Begin with 1/2 tab twice a day for the first week, then may increase to 1 tab twice a day, Disp: 60 tablet, Rfl: 2   traZODone (DESYREL) 50 MG tablet, Take 1 tablet (50 mg total) by mouth at bedtime., Disp: 30 tablet, Rfl: 1   Vibegron (GEMTESA) 75 MG TABS, Take by mouth. Received samples from Urology, Disp: , Rfl:   Observations/Objective: There were no vitals filed for this visit. Physical Exam Vitals and nursing note reviewed.  Constitutional:      General: She is not in acute distress. HENT:  Head: Normocephalic and atraumatic.  Eyes:     Extraocular Movements: Extraocular movements intact.  Pulmonary:     Effort: Pulmonary effort is normal.  Musculoskeletal:     Cervical back: Normal range of motion.  Neurological:     General: No focal deficit present.     Mental Status: She is alert and oriented to person, place, and time.  Psychiatric:        Mood and Affect: Mood normal.        Behavior: Behavior normal.    Assessment and Plan: Primary insomnia -     traZODone HCl; Take 1 tablet (50 mg total) by mouth at bedtime.  Dispense: 30  tablet; Refill: 1  GAD (generalized anxiety disorder) -     traZODone HCl; Take 1 tablet (50 mg total) by mouth at bedtime.  Dispense: 30 tablet; Refill: 1  Will check in with me in about 2 weeks for trazodone evaluation, reevaluation of insomnia  Follow Up Instructions: Return in about 2 weeks (around 10/23/2023) for Med eval.   I discussed the assessment and treatment plan with the patient. The patient was provided an opportunity to ask questions, and all were answered. The patient agreed with the plan and demonstrated an understanding of the instructions.   The patient was advised to call back or seek an in-person evaluation if the symptoms worsen or if the condition fails to improve as anticipated.  The above assessment and management plan was discussed with the patient. The patient verbalized understanding of and has agreed to the management plan.   I spent 12 minutes on this patient encounter including counseling, diagnosis, treatment options, documentation, face-to-face time.   Sherald Barge, FNP

## 2023-10-09 ENCOUNTER — Encounter: Payer: Self-pay | Admitting: Family Medicine

## 2023-10-09 ENCOUNTER — Telehealth: Admitting: Family Medicine

## 2023-10-09 DIAGNOSIS — F411 Generalized anxiety disorder: Secondary | ICD-10-CM

## 2023-10-09 DIAGNOSIS — F5101 Primary insomnia: Secondary | ICD-10-CM | POA: Diagnosis not present

## 2023-10-09 MED ORDER — TRAZODONE HCL 50 MG PO TABS: 50.0000 mg | ORAL_TABLET | Freq: Every day | ORAL | 1 refills | Status: DC

## 2023-10-09 NOTE — Patient Instructions (Signed)
 I have sent in trazodone for you to take 1 tablet once daily in the evenings.  Take this about 20 minutes before you are ready to lie down and go to bed.  Some people have very vivid dreams as a side effect with this medication.  Follow-up with me in about 2 weeks for reevaluation

## 2023-10-17 ENCOUNTER — Encounter: Payer: Self-pay | Admitting: Family Medicine

## 2023-10-27 ENCOUNTER — Other Ambulatory Visit: Payer: Self-pay | Admitting: Family Medicine

## 2023-10-27 DIAGNOSIS — F411 Generalized anxiety disorder: Secondary | ICD-10-CM

## 2023-10-27 DIAGNOSIS — F331 Major depressive disorder, recurrent, moderate: Secondary | ICD-10-CM

## 2023-10-27 MED ORDER — BUPROPION HCL ER (XL) 150 MG PO TB24: 150.0000 mg | ORAL_TABLET | Freq: Every day | ORAL | 1 refills | Status: DC

## 2023-11-17 ENCOUNTER — Other Ambulatory Visit: Payer: Self-pay

## 2023-11-17 MED ORDER — TRAZODONE HCL 100 MG PO TABS: 100.0000 mg | ORAL_TABLET | Freq: Every day | ORAL | 1 refills | Status: DC

## 2023-12-24 ENCOUNTER — Encounter: Payer: Self-pay | Admitting: Neurology

## 2023-12-24 ENCOUNTER — Ambulatory Visit: Payer: Managed Care, Other (non HMO) | Admitting: Neurology

## 2023-12-24 VITALS — BP 129/78 | HR 89 | Ht 63.0 in | Wt 185.0 lb

## 2023-12-24 DIAGNOSIS — F439 Reaction to severe stress, unspecified: Secondary | ICD-10-CM | POA: Insufficient documentation

## 2023-12-24 DIAGNOSIS — G43709 Chronic migraine without aura, not intractable, without status migrainosus: Secondary | ICD-10-CM | POA: Diagnosis not present

## 2023-12-24 DIAGNOSIS — F411 Generalized anxiety disorder: Secondary | ICD-10-CM | POA: Insufficient documentation

## 2023-12-24 DIAGNOSIS — R4789 Other speech disturbances: Secondary | ICD-10-CM | POA: Diagnosis not present

## 2023-12-24 NOTE — Patient Instructions (Addendum)
 Effexor which may help with depression/anxiety and is a migraine preventative Would recommend treating the anxiety, therapy and psychiatry Speech therapy   Also spent time education of migraines and migraine management, medication choices, acute vs prevention, strategies as well as below To prevent or relieve headaches, try the following: Cool Compress. Lie down and place a cool compress on your head.  Avoid headache triggers. If certain foods or odors seem to have triggered your migraines in the past, avoid them. A headache diary might help you identify triggers.  Include physical activity in your daily routine. Try a daily walk or other moderate aerobic exercise.  Manage stress. Find healthy ways to cope with the stressors, such as delegating tasks on your to-do list.  Practice relaxation techniques. Try deep breathing, yoga, massage and visualization.  Eat regularly. Eating regularly scheduled meals and maintaining a healthy diet might help prevent headaches. Also, drink plenty of fluids.  Follow a regular sleep schedule. Sleep deprivation might contribute to headaches Consider biofeedback. With this mind-body technique, you learn to control certain bodily functions -- such as muscle tension, heart rate and blood pressure -- to prevent headaches or reduce headache pain.    Proceed to emergency room if you experience new or worsening symptoms or symptoms do not resolve, if you have new neurologic symptoms or if headache is severe, or for any concerning symptom.   Venlafaxine Tablets What is this medication? VENLAFAXINE (VEN la fax een) treats depression and anxiety. It increases the amount of serotonin and norepinephrine in the brain, hormones that help regulate mood. It belongs to a group of medications called SNRIs. This medicine may be used for other purposes; ask your health care provider or pharmacist if you have questions. COMMON BRAND NAME(S): Effexor What should I tell my care team  before I take this medication? They need to know if you have any of these conditions: Bleeding disorders Glaucoma Heart disease High blood pressure High cholesterol Kidney disease Liver disease Low levels of sodium in the blood Mania or bipolar disorder Seizures Suicidal thoughts, plans, or attempt by you or a family member Take medications that treat or prevent blood clots Thyroid  disease An unusual or allergic reaction to venlafaxine, other medications, foods, dyes, or preservatives Pregnant or trying to get pregnant Breastfeeding How should I use this medication? Take this medication by mouth with a glass of water. Take it as directed on the prescription label. Take it with food. Take your medication at regular intervals. Do not take your medication more often than directed. Keep taking this medication unless your care team tells you to stop. Stopping it too quickly can cause serious side effects. It can also make your condition worse. A special MedGuide will be given to you by the pharmacist with each prescription and refill. Be sure to read this information carefully each time. Talk to your care team about the use of this medication in children. Special care may be needed. Overdosage: If you think you have taken too much of this medicine contact a poison control center or emergency room at once. NOTE: This medicine is only for you. Do not share this medicine with others. What if I miss a dose? If you miss a dose, take it as soon as you can. If it is almost time for your next dose, take only that dose. Do not take double or extra doses. What may interact with this medication? Do not take this medication with any of the following: Certain medications for fungal  infections, such as fluconazole, itraconazole, ketoconazole, posaconazole, voriconazole Cisapride Desvenlafaxine Dronedarone Duloxetine Levomilnacipran Linezolid MAOIs, such as Carbex, Eldepryl, Marplan, Nardil, and  Parnate Methylene blue (injected into a vein) Milnacipran Pimozide Thioridazine This medication may also interact with the following: Amphetamines Aspirin and aspirin-like medications Certain medications for mental health conditions Certain medications for migraine headaches, such as almotriptan, eletriptan, frovatriptan, naratriptan, rizatriptan, sumatriptan, zolmitriptan Certain medications for sleep Certain medications that treat or prevent blood clots, such as dalteparin, enoxaparin , warfarin Cimetidine Clozapine Diuretics Fentanyl  Furazolidone Indinavir Isoniazid Lithium Metoprolol NSAIDS, medications for pain and inflammation, such as ibuprofen  or naproxen Other medications that cause heart rhythm changes Procarbazine Rasagiline Supplements, such as St. John's Wort, kava kava, valerian Tramadol Tryptophan This list may not describe all possible interactions. Give your health care provider a list of all the medicines, herbs, non-prescription drugs, or dietary supplements you use. Also tell them if you smoke, drink alcohol, or use illegal drugs. Some items may interact with your medicine. What should I watch for while using this medication? Tell your care team if your symptoms do not get better or if they get worse. Visit your care team for regular checks on your progress. Because it may take several weeks to see the full effects of this medication, it is important to continue your treatment as prescribed by your care team. Watch for new or worsening thoughts of suicide or depression. This includes sudden changes in mood, behaviors, or thoughts. These changes can happen at any time but are more common in the beginning of treatment or after a change in dose. Call your care team right away if you experience these thoughts or worsening depression. This medication may cause mood and behavior changes, such as anxiety, nervousness, irritability, hostility, restlessness, excitability,  hyperactivity, or trouble sleeping. These changes can happen at any time but are more common in the beginning of treatment or after a change in dose. Call your care team right away if you notice any of these symptoms. This medication can cause an increase in blood pressure. Check with your care team for instructions on monitoring your blood pressure while taking this medication. This medication may affect your coordination, reaction time, or judgment. Do not drive or operate machinery until you know how this medication affects you. Sit up or stand slowly to reduce the risk of dizzy or fainting spells. Drinking alcohol with this medication can increase the risk of these side effects. Your mouth may get dry. Chewing sugarless gum or sucking hard candy and drinking plenty of water may help. Contact your care team if the problem does not go away or is severe. What side effects may I notice from receiving this medication? Side effects that you should report to your care team as soon as possible: Allergic reactions--skin rash, itching, hives, swelling of the face, lips, tongue, or throat Bleeding--bloody or black, tar-like stools, red or dark brown urine, vomiting blood or brown material that looks like coffee grounds, small, red or purple spots on skin, unusual bleeding or bruising Heart rhythm changes--fast or irregular heartbeat, dizziness, feeling faint or lightheaded, chest pain, trouble breathing Increase in blood pressure Loss of appetite with weight loss Low sodium level--muscle weakness, fatigue, dizziness, headache, confusion Serotonin syndrome--irritability, confusion, fast or irregular heartbeat, muscle stiffness, twitching muscles, sweating, high fever, seizures, chills, vomiting, diarrhea Sudden eye pain or change in vision such as blurry vision, seeing halos around lights, vision loss Thoughts of suicide or self-harm, worsening mood, feelings of depression Side effects that  usually do not  require medical attention (report to your care team if they continue or are bothersome): Anxiety, nervousness Change in sex drive or performance Dizziness Dry mouth Excessive sweating Nausea Tremors or shaking Trouble sleeping This list may not describe all possible side effects. Call your doctor for medical advice about side effects. You may report side effects to FDA at 1-800-FDA-1088. Where should I keep my medication? Keep out of the reach of children and pets. Store at a controlled temperature between 20 and 25 degrees C (68 and 77 degrees F), in a dry place. Throw away any unused medication after the expiration date. NOTE: This sheet is a summary. It may not cover all possible information. If you have questions about this medicine, talk to your doctor, pharmacist, or health care provider.  2024 [employer]/Gold Standard (2022-06-20 00:00:00)

## 2023-12-24 NOTE — Progress Notes (Signed)
 [facility]    Provider:  Dr Tresia Fruit Requesting Provider: Wellington Half, * Primary Care Provider:  Wellington Half, FNP  CC: hx of  Migraines  HPI:  Elizabeth [name] is a 33 y.o. female here as requested by Wellington Half, * for migraines.  She has a past medical history of anxiety, vitamin D  deficiency, postpartum depression, obesity, elevated lipids. She has a hx of migraine sin HS. She has an annoying headache. She can get headaches every day then goes a while without having any. She started notcing an incresae in headaches in the last year. Different than prior headaches. Dull throbbing, can be in the back or the front can be unilateral or the whole head, she had a CT scan which was normal, No inciting events but since a 3-year there is more stress working full time and she has more anxiety, also feels she cannot get her words out, getting worse, so much anxiety she is stressed She can;t get her words out because of her anxiety. Also light sensitivity, no nausea, she works at [employer], she gets anxiety talking on the phone, stares at a computer screen all day. 15 days of headaches/migraines. She had her mirena  taken out. Worse before period. Worse with movement. She has sound sesnitivity, no significant nausea, she is not sleeping well she takes trazodone  for sleep. She doesn't sleep well. Was on topiramate , stopped due to side effects.   Reviewed notes, labs and imaging from outside physicians, which showed:   From a thorough review of records and patient report, Medications tried that can be used in migraine/headache management greater than 3 months include: Lifestyle modification, headache diaries, better sleep hygiene, exercise, management of migraine triggers, OTC and prescribed analgesics/nsaids such as ibuprofen , excedrin, alleve and others, buspar , wellbutrin , flexeril , lexapro , reglan , zofran , paxil,  phenergan , zoloft , topamax , trazodone ,  amitriptyline/nortriptline contraindicated due to risk of seratonin syndrome if administered with the trazodone  she is currently taking, propranolol contraindicated states she is usually 90-110.   09/02/2023: CLINICAL DATA:  Headache, increasing frequency or severity.   EXAM: CT HEAD WITHOUT CONTRAST   TECHNIQUE: Contiguous axial images were obtained from the base of the skull through the vertex without intravenous contrast.   RADIATION DOSE REDUCTION: This exam was performed according to the departmental dose-optimization program which includes automated exposure control, adjustment of the mA and/or kV according to patient size and/or use of iterative reconstruction technique.   COMPARISON:  04/08/2006   FINDINGS: Brain: No evidence of acute infarction, hemorrhage, hydrocephalus, extra-axial collection or mass lesion/mass effect.   Vascular: No hyperdense vessel or unexpected calcification.   Skull: Normal. Negative for fracture or focal lesion.   Sinuses/Orbits: No acute finding.   IMPRESSION: No explanation for headache.  Cbc/cmp reviewed as below, normal 08/15/2023      Latest Ref Rng & Units 08/15/2023    8:49 AM 06/27/2023   10:11 AM 07/06/2020    1:48 PM  CBC  WBC 4.0 - 10.5 K/uL 6.8  7.9  9.9   Hemoglobin 12.0 - 15.0 g/dL 14.7  82.9  56.2   Hematocrit 36.0 - 46.0 % 43.4  45.0  40.3   Platelets 150.0 - 400.0 K/uL 235.0  234.0  477       Latest Ref Rng & Units 06/27/2023   10:11 AM 07/06/2020    1:48 PM 04/25/2020    7:48 PM  CMP  Glucose 70 - 99 mg/dL 79  84  89   BUN 6 -  23 mg/dL 13  16  6    Creatinine 0.40 - 1.20 mg/dL 6.96  2.95  2.84   Sodium 135 - 145 mEq/L 140  138  132   Potassium 3.5 - 5.1 mEq/L 4.1  4.0  3.5   Chloride 96 - 112 mEq/L 104  105  101   CO2 19 - 32 mEq/L 28  21  21    Calcium 8.4 - 10.5 mg/dL 9.2  8.8  8.6   Total Protein 6.0 - 8.3 g/dL 7.3  6.6  6.3   Total Bilirubin 0.2 - 1.2 mg/dL 0.6  0.6  0.4   Alkaline Phos 39 - 117 U/L 56  81   89   AST 0 - 37 U/L 14  16  16    ALT 0 - 35 U/L 12  18  15     TSH 1.20 nml 24-Jun-1991  Review of Systems: Patient complains of symptoms per HPI as well as the following symptoms anxiety, insomnia, fatigue,IUD removal after embedded in the uterus removed 10/01/2023, overactive bladder. Pertinent negatives and positives per HPI. All others negative.   Social History   Socioeconomic History   Marital status: Single    Spouse name: Not on file   Number of children: Not on file   Years of education: Not on file   Highest education level: 12th grade  Occupational History   Not on file  Tobacco Use   Smoking status: Never   Smokeless tobacco: Never  Vaping Use   Vaping status: Never Used  Substance and Sexual Activity   Alcohol use: Not Currently    Comment: social   Drug use: No   Sexual activity: Yes    Birth control/protection: I.U.D.  Other Topics Concern   Not on file  Social History Narrative   Pt lives with family    Pt works    Social Drivers of Corporate investment banker Strain: Low Risk  (06/25/2023)   Overall Financial Resource Strain (CARDIA)    Difficulty of Paying Living Expenses: Not very hard  Food Insecurity: No Food Insecurity (06/25/2023)   Hunger Vital Sign    Worried About Running Out of Food in the Last Year: Never true    Ran Out of Food in the Last Year: Never true  Transportation Needs: No Transportation Needs (06/25/2023)   PRAPARE - Administrator, Civil Service (Medical): No    Lack of Transportation (Non-Medical): No  Physical Activity: Insufficiently Active (06/25/2023)   Exercise Vital Sign    Days of Exercise per Week: 2 days    Minutes of Exercise per Session: 30 min  Stress: Stress Concern Present (06/25/2023)   [employer] of Occupational Health - Occupational Stress Questionnaire    Feeling of Stress : Very much  Social Connections: Moderately Isolated (06/25/2023)   Social Connection and Isolation Panel     Frequency of Communication with Friends and Family: Three times a week    Frequency of Social Gatherings with Friends and Family: Once a week    Attends Religious Services: Never    Database administrator or Organizations: No    Attends Engineer, structural: Not on file    Marital Status: Living with partner  Intimate Partner Violence: Not on file    Family History  Problem Relation Age of Onset   Cancer Maternal Aunt    Cancer Maternal Grandmother     Past Medical History:  Diagnosis Date   Anxiety  Migraines     Patient Active Problem List   Diagnosis Date Noted   Chronic migraine without aura without status migrainosus, not intractable 12/24/2023   GAD (generalized anxiety disorder) 12/24/2023   Stress 12/24/2023   Obesity, morbid (HCC) 06/27/2023   Postpartum depression 08/03/2020   History of cesarean section 06/25/2020   Anxiety    Vitamin D  deficiency 02/18/2019    Past Surgical History:  Procedure Laterality Date   CESAREAN SECTION N/A 06/25/2020   Procedure: CESAREAN SECTION;  Surgeon: Wendelyn Halter, MD;  Location: [facility] LD ORS;  Service: Obstetrics;  Laterality: N/A;   IUD INSERTION  08/03/2020       WISDOM TOOTH EXTRACTION      Current Outpatient Medications  Medication Sig Dispense Refill   busPIRone  (BUSPAR ) 5 MG tablet Take 1 tablet (5 mg total) by mouth 2 (two) times daily as needed. 60 tablet 0   cholecalciferol (VITAMIN D3) 25 MCG (1000 UNIT) tablet Take 1,000 Units by mouth daily.     fluticasone  (FLONASE ) 50 MCG/ACT nasal spray Place 1 spray into both nostrils as needed for allergies. 16 mL 1   phentermine  (ADIPEX-P ) 37.5 MG tablet Take 1 tablet (37.5 mg total) by mouth daily before breakfast. 30 tablet 2   traZODone  (DESYREL ) 100 MG tablet Take 1 tablet (100 mg total) by mouth at bedtime. 90 tablet 1   No current facility-administered medications for this visit.    Allergies as of 12/24/2023   (No Known Allergies)    Vitals: BP  129/78   Pulse 89   Ht 5' 3 (1.6 m)   Wt 185 lb (83.9 kg)   BMI 32.77 kg/m  Last Weight:  Wt Readings from Last 1 Encounters:  12/24/23 185 lb (83.9 kg)   Last Height:   Ht Readings from Last 1 Encounters:  12/24/23 5' 3 (1.6 m)     Physical exam: Exam: Gen: NAD, conversant, well nourised, obese, well groomed                     CV: RRR, no MRG. No Carotid Bruits. No peripheral edema, warm, nontender Eyes: Conjunctivae clear without exudates or hemorrhage  Neuro: Detailed Neurologic Exam  Speech:    Speech is normal; fluent and spontaneous with normal comprehension.  Cognition:    The patient is oriented to person, place, and time;     recent and remote memory intact;     language fluent;     normal attention, concentration,     fund of knowledge Cranial Nerves:    The pupils are equal, round, and reactive to light. The fundi are normal and spontaneous venous pulsations are present. Visual fields are full to finger confrontation. Extraocular movements are intact. Trigeminal sensation is intact and the muscles of mastication are normal. The face is symmetric. The palate elevates in the midline. Hearing intact. Voice is normal. Shoulder shrug is normal. The tongue has normal motion without fasciculations.   Coordination: nml  Gait: nml  Motor Observation:    No asymmetry, no atrophy, and no involuntary movements noted. Tone:    Normal muscle tone.    Posture:    Posture is normal. normal erect    Strength:    Strength is V/V in the upper and lower limbs.      Sensation: intact to LT     Reflex Exam:  DTR's:    Deep tendon reflexes in the upper and lower extremities are normal bilaterally.   Toes:  The toes are downgoing bilaterally.   Clonus:    Clonus is absent.    Assessment/Plan:  Patient with anxiety and worsening headaches likely migraines and tension-type due to stress/anxiety  Can try her on Effexor, it is a migraine preventative and it may  help with the mood (trying to find something that could cover both). She states the Trazodone  is not helping and also doesn't help her sleep so I reached out to her pcp to see if we could stop the trazodone  and start Effexor for migraine. I know the risk for seratonin syndrome is low but still would hate for anything along those lines to happen. I thought about nortriptyline for migraine prevention and sleep but patient does't want anything that may have her gain weight which she specifically asked about, although we'd have the same problem with the trazodone . She is not using any birth control so I can't start her on the new cgrp class of medications (qulipta, ajovy, nurtec etc) and she is not willing to start using BC not even condoms so I am a bit limited. Botox may work but she would have to try ajovy/emgality and since she is not willing to use birth control, I can't try those out.  Treat anxiety/depression - stopped her wellbutrin  and ssri, would follow up with pcp and therapy. Stress and anxiety at work and at home, not sleeping well despite trazodone .She has had depression as well. The effexor may help mood and migraine. Recommend routine eye exam Last saw a therapist at [facility] earlier this year, has not followed up, highly encouraged Speech therapy for difficulty with getting words out which is really affecting her life, has generalized anxiety and also reports has social anxiet. Likely due to anxiety, but affecting her at work, can refer for speech therapy  Orders Placed This Encounter  Procedures   Ambulatory referral to Speech Therapy   No orders of the defined types were placed in this encounter.   Cc: Sherron Dom, FNP  Aldona Amel, MD  [facility] [address] [place], [place] [address]  Phone [phone] Fax [phone]  I spent 46 minutes of face-to-face and non-face-to-face time with patient on the   1. Chronic migraine without aura without status migrainosus, not intractable   2. GAD (generalized anxiety disorder)   3. Stress   4. Word finding difficulty    diagnosis.  This included previsit chart review, lab review, study review, order entry, electronic health record documentation, patient education on the different diagnostic and therapeutic options, counseling and coordination of care, risks and benefits of management, compliance, or risk factor reduction

## 2023-12-29 ENCOUNTER — Encounter: Payer: Self-pay | Admitting: Family Medicine

## 2024-01-01 ENCOUNTER — Other Ambulatory Visit: Payer: Self-pay | Admitting: Neurology

## 2024-01-01 ENCOUNTER — Telehealth: Payer: Self-pay | Admitting: Neurology

## 2024-01-01 MED ORDER — VENLAFAXINE HCL ER 75 MG PO CP24: 75.0000 mg | ORAL_CAPSULE | Freq: Every day | ORAL | 11 refills | Status: DC

## 2024-01-01 NOTE — Telephone Encounter (Signed)
 Please let patient know that her primary care is ok with her stopping the Trazodone  and starting the Venlafaxine(Effexor) as we discussed. I have called in the prescription. Thank you

## 2024-01-01 NOTE — Addendum Note (Signed)
 Addended by: Kazoua Gossen B on: 01/01/2024 10:26 AM   Modules accepted: Orders

## 2024-01-01 NOTE — Telephone Encounter (Signed)
 I called and LMVM for pt to return call concerning medication.  May also check mychart email as well.

## 2024-01-08 ENCOUNTER — Telehealth: Payer: Self-pay | Admitting: Family Medicine

## 2024-01-08 NOTE — Telephone Encounter (Signed)
 Copied from CRM [id number]. Topic: General - Call Back - No Documentation >> Jan 08, 2024 12:01 PM Geneva B wrote: Reason for CRM: lab got a sample but is missing the order  call [facility] [phone]

## 2024-01-13 NOTE — Telephone Encounter (Signed)
 Patient notified

## 2024-01-13 NOTE — Telephone Encounter (Signed)
 Patient scheduled.

## 2024-01-15 ENCOUNTER — Telehealth (INDEPENDENT_AMBULATORY_CARE_PROVIDER_SITE_OTHER): Admitting: Family Medicine

## 2024-01-15 ENCOUNTER — Encounter: Payer: Self-pay | Admitting: Family Medicine

## 2024-01-15 DIAGNOSIS — F411 Generalized anxiety disorder: Secondary | ICD-10-CM

## 2024-01-15 DIAGNOSIS — Z79899 Other long term (current) drug therapy: Secondary | ICD-10-CM | POA: Diagnosis not present

## 2024-01-15 DIAGNOSIS — F331 Major depressive disorder, recurrent, moderate: Secondary | ICD-10-CM

## 2024-01-15 DIAGNOSIS — J302 Other seasonal allergic rhinitis: Secondary | ICD-10-CM

## 2024-01-15 MED ORDER — DESVENLAFAXINE SUCCINATE ER 25 MG PO TB24: 25.0000 mg | ORAL_TABLET | Freq: Every day | ORAL | 1 refills | Status: DC

## 2024-01-15 MED ORDER — PHENTERMINE HCL 37.5 MG PO TABS: 37.5000 mg | ORAL_TABLET | Freq: Every day | ORAL | 2 refills | Status: AC

## 2024-01-15 MED ORDER — FLUTICASONE PROPIONATE 50 MCG/ACT NA SUSP
1.0000 | NASAL | 1 refills | Status: AC | PRN
Start: 2024-01-15 — End: ?

## 2024-01-15 NOTE — Progress Notes (Signed)
 Virtual Visit via Video Note  I connected with Elizabeth [name] on 01/15/24 at 11:20 AM EDT by a video enabled telemedicine application and verified that I am speaking with the correct person using two identifiers.  Patient Location: Home Provider Location: Office/Clinic  I discussed the limitations, risks, security, and privacy concerns of performing an evaluation and management service by video and the availability of in person appointments. I also discussed with the patient that there may be a patient responsible charge related to this service. The patient expressed understanding and agreed to proceed.  Subjective: PCP: Alvia Corean CROME, FNP  No chief complaint on file.  HPI 33 year old female presents for A/V visit for follow-up of depression, anxiety and medication management. Had [facility] testing done here.  Would like to go over results. States that Dr. Darleen with neurology sent in Effexor , states she has not started taking this medication.  She wanted to get results back from [facility] testing first. Denies SI, HI. Requesting refill of phentermine  today.  ROS: Per HPI  Current Outpatient Medications:    desvenlafaxine  (PRISTIQ ) 25 MG 24 hr tablet, Take 1 tablet (25 mg total) by mouth daily., Disp: 30 tablet, Rfl: 1   busPIRone  (BUSPAR ) 5 MG tablet, Take 1 tablet (5 mg total) by mouth 2 (two) times daily as needed., Disp: 60 tablet, Rfl: 0   cholecalciferol (VITAMIN D3) 25 MCG (1000 UNIT) tablet, Take 1,000 Units by mouth daily., Disp: , Rfl:    fluticasone  (FLONASE ) 50 MCG/ACT nasal spray, Place 1 spray into both nostrils as needed for allergies., Disp: 16 mL, Rfl: 1   phentermine  (ADIPEX-P ) 37.5 MG tablet, Take 1 tablet (37.5 mg total) by mouth daily before breakfast., Disp: 30 tablet, Rfl: 2  Observations/Objective: Today's Vitals   Physical Exam Vitals and nursing note reviewed.  Constitutional:      General: She is not in acute distress. HENT:     Head:  Normocephalic and atraumatic.  Eyes:     Extraocular Movements: Extraocular movements intact.  Pulmonary:     Effort: Pulmonary effort is normal.  Musculoskeletal:     Cervical back: Normal range of motion.  Neurological:     General: No focal deficit present.     Mental Status: She is alert and oriented to person, place, and time.  Psychiatric:        Mood and Affect: Mood normal.        Behavior: Behavior normal.     Assessment and Plan: MDD (major depressive disorder), recurrent episode, moderate (HCC) -     Desvenlafaxine  Succinate ER; Take 1 tablet (25 mg total) by mouth daily.  Dispense: 30 tablet; Refill: 1  GAD (generalized anxiety disorder) -     Desvenlafaxine  Succinate ER; Take 1 tablet (25 mg total) by mouth daily.  Dispense: 30 tablet; Refill: 1  Medication management -     Desvenlafaxine  Succinate ER; Take 1 tablet (25 mg total) by mouth daily.  Dispense: 30 tablet; Refill: 1  Obesity, morbid (HCC) -     Phentermine  HCl; Take 1 tablet (37.5 mg total) by mouth daily before breakfast.  Dispense: 30 tablet; Refill: 2  Seasonal allergies -     Fluticasone  Propionate; Place 1 spray into both nostrils as needed for allergies.  Dispense: 16 mL; Refill: 1    Follow Up Instructions: Return in about 4 weeks (around 02/12/2024) for meds.   I discussed the assessment and treatment plan with the patient. The patient was provided an opportunity to ask  questions, and all were answered. The patient agreed with the plan and demonstrated an understanding of the instructions.   The patient was advised to call back or seek an in-person evaluation if the symptoms worsen or if the condition fails to improve as anticipated.  The above assessment and management plan was discussed with the patient. The patient verbalized understanding of and has agreed to the management plan.   Corean LITTIE Ku, FNP

## 2024-02-10 DIAGNOSIS — Z79899 Other long term (current) drug therapy: Secondary | ICD-10-CM | POA: Insufficient documentation

## 2024-02-10 NOTE — Progress Notes (Deleted)
   Established Patient Office Visit  Subjective:     Patient ID: Elizabeth [name], female    DOB: 1991/04/20, 33 y.o.   MRN: [id number]  No chief complaint on file.   HPI  Discussed the use of AI scribe software for clinical note transcription with the patient, who gave verbal consent to proceed.  History of Present Illness      ROS Per HPI      Objective:    There were no vitals taken for this visit.   Physical Exam Vitals and nursing note reviewed.  Constitutional:      General: She is not in acute distress.    Appearance: Normal appearance. She is normal weight.  HENT:     Head: Normocephalic and atraumatic.     Right Ear: External ear normal.     Left Ear: External ear normal.     Nose: Nose normal.     Mouth/Throat:     Mouth: Mucous membranes are moist.     Pharynx: Oropharynx is clear.  Eyes:     Extraocular Movements: Extraocular movements intact.     Pupils: Pupils are equal, round, and reactive to light.  Cardiovascular:     Rate and Rhythm: Normal rate and regular rhythm.     Pulses: Normal pulses.     Heart sounds: Normal heart sounds.  Pulmonary:     Effort: Pulmonary effort is normal. No respiratory distress.     Breath sounds: Normal breath sounds. No wheezing, rhonchi or rales.  Musculoskeletal:        General: Normal range of motion.     Cervical back: Normal range of motion.     Right lower leg: No edema.     Left lower leg: No edema.  Lymphadenopathy:     Cervical: No cervical adenopathy.  Neurological:     General: No focal deficit present.     Mental Status: She is alert and oriented to person, place, and time.  Psychiatric:        Mood and Affect: Mood normal.        Thought Content: Thought content normal.     No results found for any visits on 02/13/24.  The ASCVD Risk score (Arnett DK, et al., 2019) failed to calculate for the following reasons:   The 2019 ASCVD risk score is only valid for ages 56 to 23  {Vitals History  (Optional):23777}  {Show previous labs (optional):23779}     Assessment & Plan:   Assessment and Plan Assessment & Plan      No orders of the defined types were placed in this encounter.    No orders of the defined types were placed in this encounter.   No follow-ups on file.  Corean LITTIE Ku, FNP

## 2024-02-11 ENCOUNTER — Encounter: Payer: Self-pay | Admitting: Family Medicine

## 2024-02-11 DIAGNOSIS — F331 Major depressive disorder, recurrent, moderate: Secondary | ICD-10-CM

## 2024-02-11 DIAGNOSIS — F411 Generalized anxiety disorder: Secondary | ICD-10-CM

## 2024-02-12 MED ORDER — DESVENLAFAXINE SUCCINATE ER 50 MG PO TB24: 50.0000 mg | ORAL_TABLET | Freq: Every day | ORAL | 1 refills | Status: DC

## 2024-02-13 ENCOUNTER — Ambulatory Visit: Payer: Managed Care, Other (non HMO) | Admitting: Family Medicine

## 2024-02-13 DIAGNOSIS — E559 Vitamin D deficiency, unspecified: Secondary | ICD-10-CM

## 2024-02-13 DIAGNOSIS — Z79899 Other long term (current) drug therapy: Secondary | ICD-10-CM

## 2024-02-13 DIAGNOSIS — F411 Generalized anxiety disorder: Secondary | ICD-10-CM

## 2024-03-15 ENCOUNTER — Ambulatory Visit: Admitting: Family Medicine

## 2024-03-15 DIAGNOSIS — F411 Generalized anxiety disorder: Secondary | ICD-10-CM | POA: Diagnosis not present

## 2024-03-15 DIAGNOSIS — Z79899 Other long term (current) drug therapy: Secondary | ICD-10-CM | POA: Diagnosis not present

## 2024-03-15 DIAGNOSIS — F331 Major depressive disorder, recurrent, moderate: Secondary | ICD-10-CM | POA: Diagnosis not present

## 2024-03-15 MED ORDER — LIRAGLUTIDE 18 MG/3ML ~~LOC~~ SOPN
PEN_INJECTOR | SUBCUTANEOUS | 3 refills | Status: AC
Start: 2024-03-15 — End: ?

## 2024-03-15 NOTE — Progress Notes (Unsigned)
   Established Patient Office Visit  Subjective:     Patient ID: Elizabeth [name], female    DOB: 1990/07/28, 33 y.o.   MRN: [id number]  No chief complaint on file.   HPI  Discussed the use of AI scribe software for clinical note transcription with the patient, who gave verbal consent to proceed.  History of Present Illness      ROS Per HPI      Objective:    There were no vitals taken for this visit.   Physical Exam Vitals and nursing note reviewed.  Constitutional:      General: She is not in acute distress.    Appearance: Normal appearance. She is normal weight.  HENT:     Head: Normocephalic and atraumatic.     Right Ear: External ear normal.     Left Ear: External ear normal.     Nose: Nose normal.     Mouth/Throat:     Mouth: Mucous membranes are moist.     Pharynx: Oropharynx is clear.  Eyes:     Extraocular Movements: Extraocular movements intact.     Pupils: Pupils are equal, round, and reactive to light.  Cardiovascular:     Rate and Rhythm: Normal rate and regular rhythm.     Pulses: Normal pulses.     Heart sounds: Normal heart sounds.  Pulmonary:     Effort: Pulmonary effort is normal. No respiratory distress.     Breath sounds: Normal breath sounds. No wheezing, rhonchi or rales.  Musculoskeletal:        General: Normal range of motion.     Cervical back: Normal range of motion.     Right lower leg: No edema.     Left lower leg: No edema.  Lymphadenopathy:     Cervical: No cervical adenopathy.  Neurological:     General: No focal deficit present.     Mental Status: She is alert and oriented to person, place, and time.  Psychiatric:        Mood and Affect: Mood normal.        Thought Content: Thought content normal.     No results found for any visits on 03/15/24.  The ASCVD Risk score (Arnett DK, et al., 2019) failed to calculate for the following reasons:   The 2019 ASCVD risk score is only valid for ages 23 to 13  {Vitals History  (Optional):23777}  {Show previous labs (optional):23779}     Assessment & Plan:   Assessment and Plan Assessment & Plan      No orders of the defined types were placed in this encounter.    No orders of the defined types were placed in this encounter.   No follow-ups on file.  Corean LITTIE Ku, FNP

## 2024-03-15 NOTE — Patient Instructions (Addendum)
 Magnesium glycinate supplementation at night.  Liraglutide  0.6mg  once daily injected into the stomach for 2 weeks, then increase to 1.2mg  once daily for the next 2 weeks.   May STOP phentermine  in 2 weeks after starting Liraglutide .   Continue Pristiq .  Continue Buspar  as needed.  Follow up with me in about 3 months for labs and medication management, sooner if needed.

## 2024-03-21 DIAGNOSIS — F331 Major depressive disorder, recurrent, moderate: Secondary | ICD-10-CM | POA: Insufficient documentation

## 2024-03-23 ENCOUNTER — Encounter: Payer: Self-pay | Admitting: Family Medicine

## 2024-03-23 DIAGNOSIS — U071 COVID-19: Secondary | ICD-10-CM

## 2024-03-23 MED ORDER — NIRMATRELVIR/RITONAVIR (PAXLOVID)TABLET: 3.0000 | ORAL_TABLET | Freq: Two times a day (BID) | ORAL | 0 refills | Status: AC

## 2024-03-25 ENCOUNTER — Ambulatory Visit (HOSPITAL_BASED_OUTPATIENT_CLINIC_OR_DEPARTMENT_OTHER)
Admission: EM | Admit: 2024-03-25 | Discharge: 2024-03-25 | Disposition: A | Discharge status: Home / Self Care | Attending: Family Medicine | Admitting: Family Medicine

## 2024-03-25 ENCOUNTER — Encounter (HOSPITAL_BASED_OUTPATIENT_CLINIC_OR_DEPARTMENT_OTHER): Payer: Self-pay

## 2024-03-25 DIAGNOSIS — J020 Streptococcal pharyngitis: Secondary | ICD-10-CM

## 2024-03-25 DIAGNOSIS — H6503 Acute serous otitis media, bilateral: Secondary | ICD-10-CM | POA: Diagnosis not present

## 2024-03-25 MED ORDER — AMOXICILLIN 875 MG PO TABS: 875.0000 mg | ORAL_TABLET | Freq: Two times a day (BID) | ORAL | 0 refills | Status: AC

## 2024-03-25 NOTE — Discharge Instructions (Addendum)
 Take the antibiotics as prescribed for ear infection and possible strep throat. Continue over-the-counter medications for pain, symptoms as needed. Follow-up as needed

## 2024-03-25 NOTE — ED Triage Notes (Signed)
 Bilateral ear pain onset yesterday. Dx with Covid on Tuesday. Swollen lymph nodes in neck.States has not taken any tylenol  or ibuprofen  for pain today.

## 2024-03-25 NOTE — ED Provider Notes (Signed)
 [facility]    CSN: [id number] Arrival date & time: 03/25/24  1755      History   Chief Complaint Chief Complaint  Patient presents with   Otalgia    HPI Elizabeth [name] is a 33 y.o. female.   Patient is a 33 year old female who presents today with ear pain.  Bilateral ear pain onset yesterday. Dx with Covid on Tuesday. Swollen lymph nodes in neck.States has not taken any tylenol  or ibuprofen  for pain today.    Otalgia   Past Medical History:  Diagnosis Date   Anxiety    Migraines     Patient Active Problem List   Diagnosis Date Noted   MDD (major depressive disorder), recurrent episode, moderate (HCC) 03/21/2024   Medication management 02/10/2024   Chronic migraine without aura without status migrainosus, not intractable 12/24/2023   GAD (generalized anxiety disorder) 12/24/2023   Stress 12/24/2023   Obesity, morbid (HCC) 06/27/2023   Postpartum depression 08/03/2020   History of cesarean section 06/25/2020   Anxiety    Vitamin D  deficiency 02/18/2019    Past Surgical History:  Procedure Laterality Date   CESAREAN SECTION N/A 06/25/2020   Procedure: CESAREAN SECTION;  Surgeon: Jayne Vonn DEL, MD;  Location: [facility];  Service: Obstetrics;  Laterality: N/A;   IUD INSERTION  08/03/2020       WISDOM TOOTH EXTRACTION      OB History     Gravida  1   Para  1   Term  1   Preterm  0   AB  0   Living  1      SAB  0   IAB  0   Ectopic  0   Multiple  0   Live Births  1            Home Medications    Prior to Admission medications   Medication Sig Start Date End Date Taking? Authorizing Provider  amoxicillin  (AMOXIL ) 875 MG tablet Take 1 tablet (875 mg total) by mouth 2 (two) times daily for 7 days. 03/25/24 04/01/24 Yes Zaniel Marineau A, FNP  busPIRone  (BUSPAR ) 5 MG tablet Take 1 tablet (5 mg total) by mouth 2 (two) times daily as needed. 06/27/23   Alvia Corean CROME, FNP  cholecalciferol (VITAMIN D3) 25 MCG (1000 UNIT) tablet  Take 1,000 Units by mouth daily.    [provider]  desvenlafaxine  (PRISTIQ ) 50 MG 24 hr tablet Take 1 tablet (50 mg total) by mouth daily. 02/12/24   Alvia Corean CROME, FNP  fluticasone  (FLONASE ) 50 MCG/ACT nasal spray Place 1 spray into both nostrils as needed for allergies. 01/15/24   Alvia Corean CROME, FNP  liraglutide  (VICTOZA ) 18 MG/3ML SOPN Inject 0.6mg  once daily for 2 weeks, then increase to 1.2mg  for the next 2 weeks. 03/15/24   Alvia Corean CROME, FNP  phentermine  (ADIPEX-P ) 37.5 MG tablet Take 1 tablet (37.5 mg total) by mouth daily before breakfast. 01/15/24   Alvia Corean CROME, FNP    Family History Family History  Problem Relation Age of Onset   Cancer Maternal Aunt    Cancer Maternal Grandmother     Social History Social History   Tobacco Use   Smoking status: Never   Smokeless tobacco: Never  Vaping Use   Vaping status: Never Used  Substance Use Topics   Alcohol use: Not Currently    Comment: social   Drug use: No     Allergies   Patient has no known allergies.  Review of Systems Review of Systems  HENT:  Positive for ear pain.      Physical Exam Triage Vital Signs ED Triage Vitals  Encounter Vitals Group     BP 03/25/24 1819 114/82     Girls Systolic BP Percentile --      Girls Diastolic BP Percentile --      Boys Systolic BP Percentile --      Boys Diastolic BP Percentile --      Pulse Rate 03/25/24 1819 94     Resp 03/25/24 1819 20     Temp 03/25/24 1819 98.8 F (37.1 C)     Temp Source 03/25/24 1819 Oral     SpO2 03/25/24 1819 97 %     Weight --      Height --      Head Circumference --      Peak Flow --      Pain Score 03/25/24 1820 6     Pain Loc --      Pain Education --      Exclude from Growth Chart --    No data found.  Updated Vital Signs BP 114/82 (BP Location: Right Arm)   Pulse 94   Temp 98.8 F (37.1 C) (Oral)   Resp 20   LMP 03/15/2024   SpO2 97%   Visual Acuity Right Eye Distance:   Left  Eye Distance:   Bilateral Distance:    Right Eye Near:   Left Eye Near:    Bilateral Near:     Physical Exam Vitals and nursing note reviewed.  Constitutional:      General: She is not in acute distress.    Appearance: Normal appearance. She is not ill-appearing, toxic-appearing or diaphoretic.  HENT:     Head: Normocephalic and atraumatic.     Ears:     Comments: Bilateral serous otitis media    Nose: Congestion and rhinorrhea present.     Mouth/Throat:     Pharynx: Posterior oropharyngeal erythema present.     Tonsils: Tonsillar exudate present. 3+ on the right. 3+ on the left.  Eyes:     Conjunctiva/sclera: Conjunctivae normal.  Cardiovascular:     Rate and Rhythm: Normal rate and regular rhythm.     Pulses: Normal pulses.     Heart sounds: Normal heart sounds.  Pulmonary:     Effort: Pulmonary effort is normal.     Breath sounds: Normal breath sounds.  Lymphadenopathy:     Cervical: Cervical adenopathy present.  Skin:    General: Skin is warm and dry.  Neurological:     Mental Status: She is alert.  Psychiatric:        Mood and Affect: Mood normal.      UC Treatments / Results  Labs (all labs ordered are listed, but only abnormal results are displayed) Labs Reviewed - No data to display  EKG   Radiology No results found.  Procedures Procedures (including critical care time)  Medications Ordered in UC Medications - No data to display  Initial Impression / Assessment and Plan / UC Course  I have reviewed the triage vital signs and the nursing notes.  Pertinent labs & imaging results that were available during my care of the patient were reviewed by me and considered in my medical decision making (see chart for details).     No recurrent acute serous otitis media and streptococcal sore throat.-Treating with amoxicillin .  Antibiotics as prescribed.  Recommend she can continue over-the-counter medications  for symptoms and pain as needed.  Follow-up as  needed Final Clinical Impressions(s) / UC Diagnoses   Final diagnoses:  Non-recurrent acute serous otitis media of both ears  Streptococcal sore throat     Discharge Instructions      Take the antibiotics as prescribed for ear infection and possible strep throat. Continue over-the-counter medications for pain, symptoms as needed. Follow-up as needed    ED Prescriptions     Medication Sig Dispense Auth. Provider   amoxicillin  (AMOXIL ) 875 MG tablet Take 1 tablet (875 mg total) by mouth 2 (two) times daily for 7 days. 14 tablet Adah Wilbert LABOR, FNP      PDMP not reviewed this encounter.   Adah Wilbert LABOR, FNP 03/29/24 1140

## 2024-04-09 ENCOUNTER — Other Ambulatory Visit: Payer: Self-pay | Admitting: Family Medicine

## 2024-04-09 DIAGNOSIS — F331 Major depressive disorder, recurrent, moderate: Secondary | ICD-10-CM

## 2024-05-04 ENCOUNTER — Ambulatory Visit (HOSPITAL_BASED_OUTPATIENT_CLINIC_OR_DEPARTMENT_OTHER)
Admission: RE | Admit: 2024-05-04 | Discharge: 2024-05-04 | Disposition: A | Discharge status: Home / Self Care | Source: Ambulatory Visit | Attending: Student | Admitting: Student

## 2024-05-04 ENCOUNTER — Other Ambulatory Visit (HOSPITAL_BASED_OUTPATIENT_CLINIC_OR_DEPARTMENT_OTHER): Payer: Self-pay

## 2024-05-04 ENCOUNTER — Encounter (HOSPITAL_BASED_OUTPATIENT_CLINIC_OR_DEPARTMENT_OTHER): Payer: Self-pay

## 2024-05-04 VITALS — BP 111/77 | HR 87 | Temp 98.4°F | Resp 20

## 2024-05-04 DIAGNOSIS — J019 Acute sinusitis, unspecified: Secondary | ICD-10-CM | POA: Diagnosis not present

## 2024-05-04 MED ORDER — AMOXICILLIN-POT CLAVULANATE 875-125 MG PO TABS
1.0000 | ORAL_TABLET | Freq: Two times a day (BID) | ORAL | 0 refills | Status: DC
  Filled 2024-05-04: qty 14, 7d supply, fill #0

## 2024-05-04 NOTE — Discharge Instructions (Addendum)
-  Start the antibiotic-Augmentin (amoxicillin -clavulanate), 1 pill every 12 hours for 7 days.  You can take this with food like with breakfast and dinner. -Continue over-the-counter medications if they are helping (I.e. flonase , ibuprofen ) -Your cough should slowly get better instead of worse. If you develop a cough productive of dark or red sputum, new shortness of breath, new chest tightness, new fevers, etc - seek additional care.

## 2024-05-04 NOTE — ED Triage Notes (Signed)
 Pt c/o productive cough for the last 3 weeks. She is also having nasal congestion, HA, and slight chills. Pt denies fever, body aches, or sore throat. Pt has taken musinex and Flonase  with slight relief.

## 2024-05-04 NOTE — ED Provider Notes (Signed)
 [facility]    CSN: [id number] Arrival date & time: 05/04/24  1223      History   Chief Complaint Chief Complaint  Patient presents with   Cough    Have had a cough for 2-3 weeks. Coughing up green mucus. Also runny/stuffy nose that has started in the last week. - Entered by patient    HPI Elizabeth [name] is a 33 y.o. female presenting with cough x2-3 weeks. History as below; no PMH of asthma or pulm ds. Pt c/o productive cough for the last 3 weeks. She is also having nasal congestion, HA, and slight chills. Pt denies fever, body aches, or sore throat. Nasal congestion is getting worse. Cough is productive of yellow and green sputum. Pt has taken musinex and Flonase  with slight relief.  Also notes she had covid-19 6 weeks ago, which resolved after paxlovid , though she did get an ear infection. Denies SOB, chest tightness, fevers.   HPI  Past Medical History:  Diagnosis Date   Anxiety    Migraines     Patient Active Problem List   Diagnosis Date Noted   MDD (major depressive disorder), recurrent episode, moderate (HCC) 03/21/2024   Medication management 02/10/2024   Chronic migraine without aura without status migrainosus, not intractable 12/24/2023   GAD (generalized anxiety disorder) 12/24/2023   Stress 12/24/2023   Obesity, morbid (HCC) 06/27/2023   Postpartum depression 08/03/2020   History of cesarean section 06/25/2020   Anxiety    Vitamin D  deficiency 02/18/2019    Past Surgical History:  Procedure Laterality Date   CESAREAN SECTION N/A 06/25/2020   Procedure: CESAREAN SECTION;  Surgeon: Jayne Vonn DEL, MD;  Location: [facility];  Service: Obstetrics;  Laterality: N/A;   IUD INSERTION  08/03/2020   removed 2025   WISDOM TOOTH EXTRACTION      OB History     Gravida  1   Para  1   Term  1   Preterm  0   AB  0   Living  1      SAB  0   IAB  0   Ectopic  0   Multiple  0   Live Births  1            Home Medications    Prior to  Admission medications   Medication Sig Start Date End Date Taking? Authorizing Provider  amoxicillin -clavulanate (AUGMENTIN) 875-125 MG tablet Take 1 tablet by mouth every 12 (twelve) hours. 05/04/24  Yes Hudsyn Champine E, PA-C  busPIRone  (BUSPAR ) 5 MG tablet Take 1 tablet (5 mg total) by mouth 2 (two) times daily as needed. 06/27/23   Alvia Corean CROME, FNP  cholecalciferol (VITAMIN D3) 25 MCG (1000 UNIT) tablet Take 1,000 Units by mouth daily.    [provider]  desvenlafaxine  (PRISTIQ ) 50 MG 24 hr tablet Take 1 tablet by mouth once daily 04/09/24   Alvia Corean CROME, FNP  fluticasone  (FLONASE ) 50 MCG/ACT nasal spray Place 1 spray into both nostrils as needed for allergies. 01/15/24   Alvia Corean CROME, FNP  liraglutide  (VICTOZA ) 18 MG/3ML SOPN Inject 0.6mg  once daily for 2 weeks, then increase to 1.2mg  for the next 2 weeks. 03/15/24   Alvia Corean CROME, FNP  phentermine  (ADIPEX-P ) 37.5 MG tablet Take 1 tablet (37.5 mg total) by mouth daily before breakfast. 01/15/24   Alvia Corean CROME, FNP    Family History Family History  Problem Relation Age of Onset   Cancer Maternal Aunt  Cancer Maternal Grandmother     Social History Social History   Tobacco Use   Smoking status: Never   Smokeless tobacco: Never  Vaping Use   Vaping status: Never Used  Substance Use Topics   Alcohol use: Not Currently    Comment: social   Drug use: No     Allergies   Patient has no known allergies.   Review of Systems Review of Systems  Constitutional:  Negative for appetite change, chills and fever.  HENT:  Positive for congestion. Negative for ear pain, rhinorrhea, sinus pressure, sinus pain and sore throat.   Eyes:  Negative for redness and visual disturbance.  Respiratory:  Positive for cough. Negative for chest tightness, shortness of breath and wheezing.   Cardiovascular:  Negative for chest pain and palpitations.  Gastrointestinal:  Negative for abdominal pain,  constipation, diarrhea, nausea and vomiting.  Genitourinary:  Negative for dysuria, frequency and urgency.  Musculoskeletal:  Negative for myalgias.  Neurological:  Negative for dizziness, weakness and headaches.  Psychiatric/Behavioral:  Negative for confusion.   All other systems reviewed and are negative.    Physical Exam Triage Vital Signs ED Triage Vitals  Encounter Vitals Group     BP 05/04/24 1238 111/77     Girls Systolic BP Percentile --      Girls Diastolic BP Percentile --      Boys Systolic BP Percentile --      Boys Diastolic BP Percentile --      Pulse Rate 05/04/24 1238 87     Resp 05/04/24 1238 20     Temp 05/04/24 1238 98.4 F (36.9 C)     Temp Source 05/04/24 1238 Oral     SpO2 05/04/24 1238 96 %     Weight --      Height --      Head Circumference --      Peak Flow --      Pain Score 05/04/24 1236 0     Pain Loc --      Pain Education --      Exclude from Growth Chart --    No data found.  Updated Vital Signs BP 111/77 (BP Location: Right Arm)   Pulse 87   Temp 98.4 F (36.9 C) (Oral)   Resp 20   LMP 04/16/2024 (Approximate)   SpO2 96%   Visual Acuity Right Eye Distance:   Left Eye Distance:   Bilateral Distance:    Right Eye Near:   Left Eye Near:    Bilateral Near:     Physical Exam Vitals reviewed.  Constitutional:      General: She is not in acute distress.    Appearance: Normal appearance. She is not ill-appearing.  HENT:     Head: Normocephalic and atraumatic.     Right Ear: Tympanic membrane, ear canal and external ear normal. No tenderness. No middle ear effusion. There is no impacted cerumen. Tympanic membrane is not perforated, erythematous, retracted or bulging.     Left Ear: Tympanic membrane, ear canal and external ear normal. No tenderness.  No middle ear effusion. There is no impacted cerumen. Tympanic membrane is not perforated, erythematous, retracted or bulging.     Nose: Nose normal. No congestion.     Mouth/Throat:      Mouth: Mucous membranes are moist.     Pharynx: Uvula midline. No oropharyngeal exudate or posterior oropharyngeal erythema.  Eyes:     Extraocular Movements: Extraocular movements intact.     Pupils:  Pupils are equal, round, and reactive to light.  Cardiovascular:     Rate and Rhythm: Normal rate and regular rhythm.     Heart sounds: Normal heart sounds.  Pulmonary:     Effort: Pulmonary effort is normal.     Breath sounds: Normal breath sounds. No decreased breath sounds, wheezing, rhonchi or rales.  Abdominal:     Palpations: Abdomen is soft.     Tenderness: There is no abdominal tenderness. There is no guarding or rebound.  Lymphadenopathy:     Cervical: No cervical adenopathy.     Right cervical: No superficial cervical adenopathy.    Left cervical: No superficial cervical adenopathy.  Neurological:     General: No focal deficit present.     Mental Status: She is alert and oriented to person, place, and time.  Psychiatric:        Mood and Affect: Mood normal.        Behavior: Behavior normal.        Thought Content: Thought content normal.        Judgment: Judgment normal.      UC Treatments / Results  Labs (all labs ordered are listed, but only abnormal results are displayed) Labs Reviewed - No data to display  EKG   Radiology No results found.  Procedures Procedures (including critical care time)  Medications Ordered in UC Medications - No data to display  Initial Impression / Assessment and Plan / UC Course  I have reviewed the triage vital signs and the nursing notes.  Pertinent labs & imaging results that were available during my care of the patient were reviewed by me and considered in my medical decision making (see chart for details).     Patient is a pleasant 33 year old female presenting with secondary sickening syndrome after about 3 weeks of viral symptoms.  She is afebrile and nontachycardic, with no adventitious breath sounds on exam, and  she does not have a history of pulmonary disease.  Did not check covid/influenza test due to duration of symtoms.  Negative home pregnancy test 2 days ago.  Will manage with Augmentin.  Return precautions as below.  Final Clinical Impressions(s) / UC Diagnoses   Final diagnoses:  Acute non-recurrent sinusitis, unspecified location     Discharge Instructions      -Start the antibiotic-Augmentin (amoxicillin -clavulanate), 1 pill every 12 hours for 7 days.  You can take this with food like with breakfast and dinner. -Continue over-the-counter medications if they are helping (I.e. flonase , ibuprofen ) -Your cough should slowly get better instead of worse. If you develop a cough productive of dark or red sputum, new shortness of breath, new chest tightness, new fevers, etc - seek additional care.      ED Prescriptions     Medication Sig Dispense Auth. Provider   amoxicillin -clavulanate (AUGMENTIN) 875-125 MG tablet Take 1 tablet by mouth every 12 (twelve) hours. 14 tablet Consuella Scurlock E, PA-C      PDMP not reviewed this encounter.   Arlyss Leita BRAVO, PA-C 05/04/24 1319

## 2024-05-11 ENCOUNTER — Other Ambulatory Visit: Payer: Self-pay | Admitting: Family Medicine

## 2024-05-11 DIAGNOSIS — F331 Major depressive disorder, recurrent, moderate: Secondary | ICD-10-CM

## 2024-05-17 ENCOUNTER — Encounter: Payer: Self-pay | Admitting: Family Medicine

## 2024-05-18 ENCOUNTER — Encounter (HOSPITAL_BASED_OUTPATIENT_CLINIC_OR_DEPARTMENT_OTHER): Payer: Self-pay

## 2024-05-18 ENCOUNTER — Ambulatory Visit (HOSPITAL_BASED_OUTPATIENT_CLINIC_OR_DEPARTMENT_OTHER): Admit: 2024-05-18 | Discharge: 2024-05-18 | Disposition: A | Admitting: Radiology

## 2024-05-18 ENCOUNTER — Other Ambulatory Visit (HOSPITAL_BASED_OUTPATIENT_CLINIC_OR_DEPARTMENT_OTHER): Payer: Self-pay

## 2024-05-18 ENCOUNTER — Ambulatory Visit (HOSPITAL_BASED_OUTPATIENT_CLINIC_OR_DEPARTMENT_OTHER)
Admission: RE | Admit: 2024-05-18 | Discharge: 2024-05-18 | Disposition: A | Discharge status: Home / Self Care | Source: Ambulatory Visit | Attending: Family Medicine | Admitting: Family Medicine

## 2024-05-18 VITALS — BP 106/76 | HR 105 | Temp 98.5°F | Resp 18

## 2024-05-18 DIAGNOSIS — R051 Acute cough: Secondary | ICD-10-CM

## 2024-05-18 DIAGNOSIS — J208 Acute bronchitis due to other specified organisms: Secondary | ICD-10-CM | POA: Diagnosis not present

## 2024-05-18 MED ORDER — PROMETHAZINE-DM 6.25-15 MG/5ML PO SYRP
5.0000 mL | ORAL_SOLUTION | Freq: Four times a day (QID) | ORAL | 0 refills | Status: AC | PRN
  Filled 2024-05-18: qty 118, 6d supply, fill #0

## 2024-05-18 MED ORDER — METHYLPREDNISOLONE ACETATE 80 MG/ML IJ SUSP
80.0000 mg | Freq: Once | INTRAMUSCULAR | Status: AC
  Administered 2024-05-18: 80 mg via INTRAMUSCULAR

## 2024-05-18 MED ORDER — AIRSUPRA 90-80 MCG/ACT IN AERO
2.0000 | INHALATION_SPRAY | RESPIRATORY_TRACT | 0 refills | Status: AC | PRN
  Filled 2024-05-18: qty 10.7, 10d supply, fill #0

## 2024-05-18 NOTE — Discharge Instructions (Addendum)
 Acute viral bronchitis with cough: Chest x-ray is negative.  Airsupra, 2 puffs, every 4 hours if needed for wheezing.  Promethazine  DM, 5 mL, every 6 hours if needed for cough.  Depo-Medrol, 80 mg injection now.  Get plenty of fluids and rest.  I do not believe additional antibiotics are needed.  Declined work excuse.  Follow-up if symptoms do not completely resolve, if symptoms worsen or if new symptoms occur.

## 2024-05-18 NOTE — ED Triage Notes (Addendum)
 Pt was seen on 10/28. Pt c/o of cough, nasal congestion, fatigue started over the weekend. Denies fever. Pt reports taking mucinex and zyrtec .

## 2024-05-18 NOTE — ED Provider Notes (Signed)
 [facility]    CSN: [id number] Arrival date & time: 05/18/24  0934      History   Chief Complaint Chief Complaint  Patient presents with   Cough    Still dealing with a cough for over a month. My voice is very hoarse now. Lots of nasal congestion. - Entered by patient    HPI Elizabeth [name] is a 33 y.o. female.   Patient was seen on 05/04/2024 here at this urgent care.  He was treated for sinusitis with Augmentin 875-125 mg, 1 pill twice daily for 7 days.  She has a persistent cough, nasal congestion, fatigue.  She got better while she was on the antibiotics but symptoms started again on 05/15/2024.  She denies fever, nausea, vomiting, constipation, diarrhea.  She is currently taking Mucinex and Zyrtec  OTC.   Cough Associated symptoms: rhinorrhea   Associated symptoms: no chest pain, no chills, no ear pain, no fever, no rash, no shortness of breath and no sore throat     Past Medical History:  Diagnosis Date   Anxiety    Migraines     Patient Active Problem List   Diagnosis Date Noted   MDD (major depressive disorder), recurrent episode, moderate (HCC) 03/21/2024   Medication management 02/10/2024   Chronic migraine without aura without status migrainosus, not intractable 12/24/2023   GAD (generalized anxiety disorder) 12/24/2023   Stress 12/24/2023   Obesity, morbid (HCC) 06/27/2023   Postpartum depression 08/03/2020   History of cesarean section 06/25/2020   Anxiety    Vitamin D  deficiency 02/18/2019    Past Surgical History:  Procedure Laterality Date   CESAREAN SECTION N/A 06/25/2020   Procedure: CESAREAN SECTION;  Surgeon: Jayne Vonn DEL, MD;  Location: [facility];  Service: Obstetrics;  Laterality: N/A;   IUD INSERTION  08/03/2020   removed 2025   WISDOM TOOTH EXTRACTION      OB History     Gravida  1   Para  1   Term  1   Preterm  0   AB  0   Living  1      SAB  0   IAB  0   Ectopic  0   Multiple  0   Live Births  1             Home Medications    Prior to Admission medications   Medication Sig Start Date End Date Taking? Authorizing Provider  Albuterol-Budesonide (AIRSUPRA) 90-80 MCG/ACT AERO Inhale 2 puffs into the lungs every 4 (four) hours as needed (wheezing.  Rinse mouth after use). 05/18/24  Yes Ival Domino, FNP  promethazine -dextromethorphan (PROMETHAZINE -DM) 6.25-15 MG/5ML syrup Take 5 mLs by mouth 4 (four) times daily as needed for cough. Do not use and drive - May make drowsy. 05/18/24  Yes Ival Domino, FNP  busPIRone  (BUSPAR ) 5 MG tablet Take 1 tablet (5 mg total) by mouth 2 (two) times daily as needed. 06/27/23   Alvia Corean CROME, FNP  cholecalciferol (VITAMIN D3) 25 MCG (1000 UNIT) tablet Take 1,000 Units by mouth daily.    [provider]  desvenlafaxine  (PRISTIQ ) 50 MG 24 hr tablet Take 1 tablet by mouth once daily 05/12/24   Alvia Corean CROME, FNP  fluticasone  (FLONASE ) 50 MCG/ACT nasal spray Place 1 spray into both nostrils as needed for allergies. 01/15/24   Alvia Corean CROME, FNP  liraglutide  (VICTOZA ) 18 MG/3ML SOPN Inject 0.6mg  once daily for 2 weeks, then increase to 1.2mg  for the next  2 weeks. 03/15/24   Alvia Corean CROME, FNP  phentermine  (ADIPEX-P ) 37.5 MG tablet Take 1 tablet (37.5 mg total) by mouth daily before breakfast. 01/15/24   Alvia Corean CROME, FNP    Family History Family History  Problem Relation Age of Onset   Cancer Maternal Aunt    Cancer Maternal Grandmother     Social History Social History   Tobacco Use   Smoking status: Never   Smokeless tobacco: Never  Vaping Use   Vaping status: Never Used  Substance Use Topics   Alcohol use: Not Currently    Comment: social   Drug use: No     Allergies   Patient has no known allergies.   Review of Systems Review of Systems  Constitutional:  Positive for fatigue. Negative for chills and fever.  HENT:  Positive for postnasal drip and rhinorrhea. Negative for ear pain and sore  throat.   Eyes:  Negative for pain and visual disturbance.  Respiratory:  Positive for cough. Negative for shortness of breath.   Cardiovascular:  Negative for chest pain and palpitations.  Gastrointestinal:  Negative for abdominal pain, constipation, diarrhea, nausea and vomiting.  Genitourinary:  Negative for dysuria and hematuria.  Musculoskeletal:  Negative for arthralgias and back pain.  Skin:  Negative for color change and rash.  Neurological:  Negative for seizures and syncope.  All other systems reviewed and are negative.    Physical Exam Triage Vital Signs ED Triage Vitals  Encounter Vitals Group     BP 05/18/24 0952 106/76     Girls Systolic BP Percentile --      Girls Diastolic BP Percentile --      Boys Systolic BP Percentile --      Boys Diastolic BP Percentile --      Pulse Rate 05/18/24 0952 (!) 105     Resp 05/18/24 0952 18     Temp 05/18/24 0952 98.5 F (36.9 C)     Temp Source 05/18/24 0952 Oral     SpO2 05/18/24 0952 96 %     Weight --      Height --      Head Circumference --      Peak Flow --      Pain Score 05/18/24 0950 0     Pain Loc --      Pain Education --      Exclude from Growth Chart --    No data found.  Updated Vital Signs BP 106/76 (BP Location: Right Arm)   Pulse (!) 105   Temp 98.5 F (36.9 C) (Oral)   Resp 18   LMP 04/16/2024 (Approximate)   SpO2 96%   Visual Acuity Right Eye Distance:   Left Eye Distance:   Bilateral Distance:    Right Eye Near:   Left Eye Near:    Bilateral Near:     Physical Exam Vitals and nursing note reviewed.  Constitutional:      General: She is not in acute distress.    Appearance: She is well-developed. She is not ill-appearing, toxic-appearing or diaphoretic.  HENT:     Head: Normocephalic and atraumatic.     Right Ear: Hearing, tympanic membrane, ear canal and external ear normal.     Left Ear: Hearing, tympanic membrane, ear canal and external ear normal.     Nose: Congestion and  rhinorrhea present. Rhinorrhea is clear.     Right Sinus: No maxillary sinus tenderness or frontal sinus tenderness.     Left  Sinus: No maxillary sinus tenderness or frontal sinus tenderness.     Mouth/Throat:     Lips: Pink.     Mouth: Mucous membranes are moist.     Pharynx: Uvula midline. Posterior oropharyngeal erythema present. No oropharyngeal exudate.     Tonsils: No tonsillar exudate.  Eyes:     Conjunctiva/sclera: Conjunctivae normal.     Pupils: Pupils are equal, round, and reactive to light.  Cardiovascular:     Rate and Rhythm: Normal rate and regular rhythm.     Heart sounds: S1 normal and S2 normal. No murmur heard. Pulmonary:     Effort: Pulmonary effort is normal. No respiratory distress.     Breath sounds: Examination of the right-upper field reveals wheezing. Examination of the left-upper field reveals wheezing. Examination of the right-middle field reveals wheezing. Examination of the left-middle field reveals wheezing. Examination of the right-lower field reveals decreased breath sounds. Examination of the left-lower field reveals decreased breath sounds. Decreased breath sounds and wheezing (Mild, intermittent inspiratory wheezes.) present. No rhonchi or rales.  Abdominal:     General: Bowel sounds are normal.     Palpations: Abdomen is soft.     Tenderness: There is no abdominal tenderness.  Musculoskeletal:        General: No swelling.     Cervical back: Neck supple.  Lymphadenopathy:     Head:     Right side of head: Tonsillar adenopathy present. No submental, submandibular, preauricular or posterior auricular adenopathy.     Left side of head: Tonsillar adenopathy present. No submental, submandibular, preauricular or posterior auricular adenopathy.     Cervical: Cervical adenopathy present.     Right cervical: Superficial cervical adenopathy present.     Left cervical: Superficial cervical adenopathy present.  Skin:    General: Skin is warm and dry.      Capillary Refill: Capillary refill takes less than 2 seconds.     Findings: No rash.  Neurological:     Mental Status: She is alert and oriented to person, place, and time.  Psychiatric:        Mood and Affect: Mood normal.      UC Treatments / Results  Labs (all labs ordered are listed, but only abnormal results are displayed) Labs Reviewed - No data to display  EKG   Radiology DG Chest 2 View Result Date: 05/18/2024 EXAM: 2 VIEW(S) XRAY OF THE CHEST 05/18/2024 10:47:24 AM COMPARISON: None available. CLINICAL HISTORY: cough FINDINGS: LUNGS AND PLEURA: No focal pulmonary opacity. No pulmonary edema. No pleural effusion. No pneumothorax. HEART AND MEDIASTINUM: No acute abnormality of the cardiac and mediastinal silhouettes. BONES AND SOFT TISSUES: No acute osseous abnormality. IMPRESSION: No acute cardiopulmonary findings. Electronically signed by: Shahmeer Lateef MD 05/18/2024 11:10 AM EST RP Workstation: [id number]    Procedures Procedures (including critical care time)  Medications Ordered in UC Medications  methylPREDNISolone acetate (DEPO-MEDROL) injection 80 mg (80 mg Intramuscular Given 05/18/24 1130)    Initial Impression / Assessment and Plan / UC Course  I have reviewed the triage vital signs and the nursing notes.  Pertinent labs & imaging results that were available during my care of the patient were reviewed by me and considered in my medical decision making (see chart for details).  Plan of Care: Acute viral bronchitis with cough: Chest x-ray is negative.  Airsupra, 2 puffs, every 4 hours if needed for wheezing.  Promethazine  DM, 5 mL, every 6 hours if needed for cough.  Depo-Medrol, 80 mg injection  now.  Get plenty of fluids and rest.  I do not believe additional antibiotics are needed.  Declined work excuse.  Follow-up if symptoms do not completely resolve, if symptoms worsen or if new symptoms occur.   I reviewed the plan of care with the patient and/or the  patient's guardian.  The patient and/or guardian had time to ask questions and acknowledged that the questions were answered.  I provided instruction on symptoms or reasons to return here or to go to an ER, if symptoms/condition did not improve, worsened or if new symptoms occurred.  Final Clinical Impressions(s) / UC Diagnoses   Final diagnoses:  Acute cough  Acute viral bronchitis     Discharge Instructions      Acute viral bronchitis with cough: Chest x-ray is negative.  Airsupra, 2 puffs, every 4 hours if needed for wheezing.  Promethazine  DM, 5 mL, every 6 hours if needed for cough.  Depo-Medrol, 80 mg injection now.  Get plenty of fluids and rest.  I do not believe additional antibiotics are needed.  Declined work excuse.  Follow-up if symptoms do not completely resolve, if symptoms worsen or if new symptoms occur.     ED Prescriptions     Medication Sig Dispense Auth. Provider   Albuterol-Budesonide (AIRSUPRA) 90-80 MCG/ACT AERO Inhale 2 puffs into the lungs every 4 (four) hours as needed (wheezing.  Rinse mouth after use). 10.7 g Ival Domino, FNP   promethazine -dextromethorphan (PROMETHAZINE -DM) 6.25-15 MG/5ML syrup Take 5 mLs by mouth 4 (four) times daily as needed for cough. Do not use and drive - May make drowsy. 118 mL Ival Domino, FNP      PDMP not reviewed this encounter.   Ival Domino, FNP 05/18/24 [phone]

## 2024-06-11 ENCOUNTER — Other Ambulatory Visit: Payer: Self-pay | Admitting: Family Medicine

## 2024-06-11 DIAGNOSIS — F331 Major depressive disorder, recurrent, moderate: Secondary | ICD-10-CM

## 2024-07-09 ENCOUNTER — Encounter: Payer: Self-pay | Admitting: Family Medicine

## 2024-07-12 MED ORDER — DESVENLAFAXINE SUCCINATE ER 100 MG PO TB24: 100.0000 mg | ORAL_TABLET | Freq: Every day | ORAL | 1 refills | Status: AC

## 2024-08-16 ENCOUNTER — Ambulatory Visit: Admitting: Adult Health

## 2024-09-13 ENCOUNTER — Ambulatory Visit: Admitting: Family Medicine
